# Patient Record
Sex: Male | Born: 1965 | Race: Black or African American | Hispanic: No | Marital: Married | Smoking: Former smoker
Health system: Southern US, Community
[De-identification: ages and names within clinical notes are randomized; demographics above are authoritative.]

## PROBLEM LIST (undated history)

## (undated) DIAGNOSIS — I10 Essential (primary) hypertension: Secondary | ICD-10-CM

## (undated) DIAGNOSIS — E291 Testicular hypofunction: Secondary | ICD-10-CM

## (undated) DIAGNOSIS — T7840XA Allergy, unspecified, initial encounter: Secondary | ICD-10-CM

## (undated) HISTORY — DX: Allergy, unspecified, initial encounter: T78.40XA

## (undated) HISTORY — PX: CHOLECYSTECTOMY: SHX55

## (undated) HISTORY — DX: Testicular hypofunction: E29.1

## (undated) HISTORY — DX: Essential (primary) hypertension: I10

---

## 2000-03-19 ENCOUNTER — Encounter: Payer: Self-pay | Admitting: Family Medicine

## 2000-03-19 ENCOUNTER — Encounter: Admission: RE | Admit: 2000-03-19 | Discharge: 2000-03-19 | Payer: Self-pay | Admitting: Family Medicine

## 2001-01-30 ENCOUNTER — Emergency Department (HOSPITAL_COMMUNITY): Admission: EM | Admit: 2001-01-30 | Discharge: 2001-01-31 | Payer: Self-pay | Admitting: Emergency Medicine

## 2001-06-22 ENCOUNTER — Emergency Department (HOSPITAL_COMMUNITY): Admission: EM | Admit: 2001-06-22 | Discharge: 2001-06-22 | Payer: Self-pay | Admitting: Emergency Medicine

## 2001-09-19 ENCOUNTER — Emergency Department (HOSPITAL_COMMUNITY): Admission: EM | Admit: 2001-09-19 | Discharge: 2001-09-19 | Payer: Self-pay | Admitting: Emergency Medicine

## 2001-09-20 ENCOUNTER — Inpatient Hospital Stay (HOSPITAL_COMMUNITY): Admission: EM | Admit: 2001-09-20 | Discharge: 2001-09-22 | Payer: Self-pay | Admitting: Emergency Medicine

## 2001-09-20 ENCOUNTER — Encounter (INDEPENDENT_AMBULATORY_CARE_PROVIDER_SITE_OTHER): Payer: Self-pay | Admitting: *Deleted

## 2001-09-20 ENCOUNTER — Encounter: Payer: Self-pay | Admitting: Surgery

## 2001-09-20 ENCOUNTER — Encounter: Payer: Self-pay | Admitting: Internal Medicine

## 2009-12-21 ENCOUNTER — Encounter: Admission: RE | Admit: 2009-12-21 | Discharge: 2009-12-21 | Payer: Self-pay | Admitting: Internal Medicine

## 2010-05-05 ENCOUNTER — Encounter: Payer: Self-pay | Admitting: Internal Medicine

## 2010-08-30 NOTE — H&P (Signed)
[facility]  Patient:    Brian Perkins, Brian Perkins Visit Number: [id number] MRN: [id number]          Service Type: MED Location: [phone] Attending Physician:  Andre Lefort Dictated by:   Sandria Bales. Ezzard Standing, M.D. Admit Date:  09/20/2001 Discharge Date: 09/22/2001   CC:         Charlynne Pander. Bruna Potter, M.D.   History and Physical  DATE OF BIRTH:  1966-01-07  HISTORY OF ILLNESS:  This is a pleasant 45 year old black male who has presented to the emergency room with abdominal pain.  Actually he was in last night and they thought he had a gastroenteritis and then he represented tonight.  He has no prior history of peptic ulcer disease, liver disease, pancreatic disease or change in bowel habits.  Evaluation this time has revealed a mildly elevated WBC of 11,300 and an ultrasound which is positive for gallstones with a thickened gallbladder wall consistent with acute cholecystitis.  Interestingly, the patient actually presented three months ago with similar symptoms and again it was blamed on gastroenteritis.  He had eaten a [employer] sandwich the other night which kind of precipitated this pain.  FAMILY HISTORY:  He has no family history of gallstone disease or other significant family history.  ALLERGIES:  He has no allergies.  CURRENT MEDICATIONS:  He is on no medications.  REVIEW OF SYSTEMS:  Pulmonary: No history of pneumonia, he does not smoke cigarettes.  Cardiac: No history of cardiac disease, chest pain or hypertension.  Gastrointestinal: No history of liver disease, pancreatic disease, change in bowel habits or prior abdominal surgery.  Urologic: No history of kidney stones or kidney infections.  SOCIAL HISTORY:  He works as a Nurse, adult, is single and comes by himself to the emergency room.  PHYSICAL EXAMINATION:  VITAL SIGNS:  His temperature is 99.3, blood pressure 149/98, pulse 90, respirations 20.  GENERAL:  He is a  well-nourished, pleasant black male alert and cooperative on physical exam.  HEENT:  Unremarkable.  NECK:  Supple, I feel no mass and no thyromegaly.  LUNGS:  His lungs are clear to auscultation and symmetric.  HEART:  His heart has a regular rate and rhythm.  ABDOMEN:  He has some epigastric right upper quadrant tenderness with some very mild maybe guarding.  I am not sure I can feel any kind of mass.  He is a pretty stocky guy.  He has no other abdominal mass.  No evidence of inguinal hernia.  EXTREMITIES:  He has good strength in all four extremities.  NEUROLOGICALLY:  He is grossly intact.  ASSESSMENT AND PLAN:  My impression is that he has acute cholecystitis and cholelithiasis.  I discussed with him that he needs surgical treatment; that the benefits of surgery and the potential risk including, but not limited to, bleeding, infection, bowel injury, bile duct injury, and possibly open surgery; I think he understands this and will proceed with surgery later this morning.  Will plan to admit him to the hospital, place him on IV fluids, IV antibiotics and schedule him at the earliest convenient time.  It is about 4 a.m. right now.  I talked to the OR and they can probably do him in about four or five hours. Dictated by:   Sandria Bales. Ezzard Standing, M.D. Attending Physician:  Andre Lefort DD:  09/20/01 TD:  09/20/01 Job: [id number]

## 2010-08-30 NOTE — Op Note (Signed)
[place]  Patient:    DUNCAN, ALEJANDRO Visit Number: [id number] MRN: [id number]          Service Type: EMS Location: [place] Attending Physician:  Nelia Shi Dictated by:   Sandria Bales. Ezzard Standing, M.D. Proc. Date: 09/20/01 Admit Date:  09/19/2001 Discharge Date: 09/19/2001   CC:         Charlynne Pander. Bruna Potter, M.D.   Operative Report  PREOPERATIVE DIAGNOSIS:   Acute  cholelithiasis and cholecystitis.  POSTOPERATIVE DIAGNOSIS:  Acute and chronic cholelithiasis and cholecystitis.  PROCEDURES:  Laparoscopic cholecystectomy with intraoperative cholangiogram.  SURGEON:  Sandria Bales. Ezzard Standing, M.D.  FIRST ASSISTANT:  Jimmye Norman, M.D.  ANESTHESIA:  General endotracheal.  ESTIMATED BLOOD LOSS:  50 cc.  DRAINS:  ________ .  INDICATIONS FOR PROCEDURE:  The patient is a 45 year old black male who presents to the emergency room with acute cholecystitis, as diagnosed both by clinical exam and ultrasound.  He now comes for attempt at a laparoscopic cholecystectomy.  Discussion carried out with the patient, both the indications and potential complications of the procedure.  Complications included, but not limited to:  bleeding, infection, bowel and bile duct injury and open surgery.  OPERATIVE NOTE:  The patient placed in the supine position and given a general endotracheal anesthetic.  He was already on Ancef as an antibiotic.  He had general anesthesia supervised by Dr. Guadalupe Maple.  His abdomen was shaved, prepped with Betadine solution and sterilely draped.  He had PAS stockings in place and an orogastric tube placed.  An infraumbilical incision was made with sharp dissection carried down to the abdominal cavity.  A zero-degree laparoscope was inserted through a 12 mm Hasson trocar.  The Hasson trocar was secured with a 0 Vicryl suture.  The abdomen was insufflated with CO2, about 4 L.  Abdominal exploration carried out to reveal the right and left  lobes of the liver unremarkable.  The stomach was somewhat dilated and anesthesia was adjusted with an NG tube to decompress this.  The remainder of his bowels were unremarkable.  Three different trocars were placed, a 10 mm Ethigon trocar in the subxiphoid location, a 5 mm Ethigon trocar in the right mid subcostal location, and a 5 mm Ethigon trocar in the right lateral subcostal location.  The gallbladder was noted to be distended and acute.  We decompressed the gallbladder first, draining off approximately 50 cc of bilious material.  The gallbladder was then grabbed, rotated cephalad.  It was noted to be both acutely inflamed and had chronic disease.  It was then dissected out to identify the cystic duct.  The triangle of Calot was identified, with at least two branches which were presumed to be cystic arteries, which were doubly clipped and divided.  After isolating out the cystic duct and gallbladder, I then placed a clip of the gallbladder side of the cystic duct and shot an intraoperative cholangiogram.  Intraoperative cholangiogram was shot using a cut-off taut catheter inserted through a 14-gauge Jelco catheter.  The taut catheter was placed beside of the cut cystic duct and then secured with an Endo-Clip.  The intraoperative cholangiogram was used under fluoroscopy, using half-strength Hyapaque solution, injecting about 8 cc.  This showed free flow of contrast down the cystic duct into the common bile duct, into the duodenum and reflux at the hepatic radicals.  This is felt to be a normal intraoperative cholangiogram.  The taut catheter was then removed.  The cystic duct  was triply Endo-clipped and divided.  The gallbladder was then sharply and bluntly dissected from the gallbladder bed, using primarily hook Bovie coagulation.  Again, there is both acute edematous changes of the gallbladder and chronic scar tissue of the gallbladder being attached to the gallbladder wall.   The gallbladder was completely removed and placed in an EndoCatch bag and delivered through the umbilicus.  There are two or three split stones which were retrieved with the "pooper-scooper".  The abdomen was irrigated with a total of about 3.5 L of saline.  There was no bleeding from the gallbladder bed or from the triangle of Calot, nor was there any bile leak.  There were no residual stones.  The trocars were each removed under direct visualization.  There was no bleeding at any trocar site.  The umbilical trocar was then closed with a 0 Vicryl suture.  The skin edge cut was closed with a 5-0 Vicryl suture; painted with tincture of Benzoin and Steri-Strips and sterilely dressed.  The patient tolerated the procedure well and was transported to the recovery room in good condition.  Sponge and needle count were correct at the end of the case. Dictated by:   Sandria Bales. Ezzard Standing, M.D. Attending Physician:  Nelia Shi DD:  09/20/01 TD:  09/21/01 Job: [id number]

## 2011-03-26 ENCOUNTER — Ambulatory Visit: Payer: Self-pay

## 2011-06-03 ENCOUNTER — Telehealth: Payer: Self-pay

## 2011-06-03 NOTE — Telephone Encounter (Signed)
PTS WIFE, JAIME CALLED STATES PT NEEDS HIS 3 MEDS REFILLED, BUT WAS TOLD TO COME IN FOR RECHECK. STATES HE IS UNEMPLOYED AND WITHOUT INS AND CAN'T AFFORD AN OV. WANTS TO KNOW IF DR WILL RECONSIDER.

## 2011-06-05 MED ORDER — HYDROCHLOROTHIAZIDE 25 MG PO TABS: 25.0000 mg | ORAL_TABLET | Freq: Every day | ORAL | Status: DC

## 2011-06-05 MED ORDER — LISINOPRIL 40 MG PO TABS: 40.0000 mg | ORAL_TABLET | Freq: Every day | ORAL | Status: DC

## 2011-06-05 MED ORDER — AMLODIPINE BESYLATE 10 MG PO TABS: 10.0000 mg | ORAL_TABLET | Freq: Every day | ORAL | Status: DC

## 2011-06-05 NOTE — Telephone Encounter (Signed)
Explained need for check up for pt. Wife agrees and he will try to get in within the month. Told wife about [facility] financial aid w/ phone number, and also that we can set up paymt plan. Wife agrees. Sent in one month RFs on all three meds with OK per Alycia Rossetti.

## 2011-06-05 NOTE — Telephone Encounter (Signed)
What are his current meds?  When does he think he could come in?  A payment agreement would be acceptable.  He could also call the [facility] Financial Aid office and see if he qualifies for assistance. [phone]

## 2011-06-05 NOTE — Telephone Encounter (Signed)
Patients  wife calling to check the status on med refill for patient she states he is out of his medication.

## 2011-06-06 ENCOUNTER — Telehealth: Payer: Self-pay

## 2011-06-06 NOTE — Telephone Encounter (Signed)
Pt called stating [facility] doesn't have Rxs sent. Checked order on last phone message and Rxs printed instead of sending to pharmacy even though "normal" was clicked. Called in Rxs and notified wife done.

## 2011-06-06 NOTE — Telephone Encounter (Signed)
Brian Perkins: Pts wife states that the pharmacy never received a fax for the three rx's lisinopril, Norvasc, and Hydrodiuril.

## 2011-07-03 ENCOUNTER — Other Ambulatory Visit: Payer: Self-pay

## 2011-07-03 MED ORDER — HYDROCHLOROTHIAZIDE 25 MG PO TABS: 25.0000 mg | ORAL_TABLET | Freq: Every day | ORAL | Status: DC

## 2011-07-09 ENCOUNTER — Telehealth: Payer: Self-pay

## 2011-07-09 ENCOUNTER — Other Ambulatory Visit: Payer: Self-pay | Admitting: *Deleted

## 2011-07-09 MED ORDER — AMLODIPINE BESYLATE 10 MG PO TABS: 10.0000 mg | ORAL_TABLET | Freq: Every day | ORAL | Status: DC

## 2011-07-09 MED ORDER — LISINOPRIL 40 MG PO TABS: 40.0000 mg | ORAL_TABLET | Freq: Every day | ORAL | Status: DC

## 2011-07-09 NOTE — Telephone Encounter (Signed)
PTS WIFE JAMIEN STATES THAT THE PHARMACY HAS ALREADY FAXED OVER A REQUEST FOR PTS REFILL ON AMLODIPINE AND LISINOPRIL AND THEY HAVE NOT HEARD ANYTHING FROM [place] AS OF YET. PHARMACY: [facility] ON [address].

## 2011-07-09 NOTE — Telephone Encounter (Signed)
Advised pt wife that rx's were being sent in.  Pt has an appt on 07/11/11

## 2011-07-11 ENCOUNTER — Telehealth: Payer: Self-pay

## 2011-07-11 ENCOUNTER — Encounter: Payer: Self-pay | Admitting: Physician Assistant

## 2011-07-11 ENCOUNTER — Ambulatory Visit (INDEPENDENT_AMBULATORY_CARE_PROVIDER_SITE_OTHER): Payer: BC Managed Care – PPO | Admitting: Physician Assistant

## 2011-07-11 VITALS — BP 134/82 | HR 119 | Temp 98.5°F | Resp 16 | Ht 71.0 in | Wt 259.8 lb

## 2011-07-11 DIAGNOSIS — I10 Essential (primary) hypertension: Secondary | ICD-10-CM | POA: Insufficient documentation

## 2011-07-11 DIAGNOSIS — E669 Obesity, unspecified: Secondary | ICD-10-CM

## 2011-07-11 MED ORDER — TESTOSTERONE CYPIONATE 200 MG/ML IM SOLN: INTRAMUSCULAR | Status: DC

## 2011-07-11 MED ORDER — HYDROCHLOROTHIAZIDE 25 MG PO TABS: 25.0000 mg | ORAL_TABLET | Freq: Every day | ORAL | Status: DC

## 2011-07-11 MED ORDER — LISINOPRIL 40 MG PO TABS: 40.0000 mg | ORAL_TABLET | Freq: Every day | ORAL | Status: DC

## 2011-07-11 MED ORDER — AMLODIPINE BESYLATE 10 MG PO TABS: 10.0000 mg | ORAL_TABLET | Freq: Every day | ORAL | Status: DC

## 2011-07-11 NOTE — Telephone Encounter (Signed)
Patient was called back and given bp reading.

## 2011-07-11 NOTE — Progress Notes (Signed)
  Subjective:    Patient ID: Brian Perkins, male    DOB: [birth year], 46 y.o.   MRN: [id number]  HPI BP's running great.  Brings in typed info from spouse Brian Perkins).  Needs cheaper testosterone alternative. He states he is doing well and will schedule a CPE in a couple of weeks.  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 Needs other meds refilled  Review of Systems  All other systems reviewed and are negative.        Objective:   Physical Exam  Constitutional: He is oriented to person, place, and time. He appears well-developed and well-nourished.  HENT:  Head: Normocephalic and atraumatic.  Cardiovascular: Normal rate, regular rhythm and normal heart sounds.   Pulmonary/Chest: Effort normal and breath sounds normal.  Neurological: He is alert and oriented to person, place, and time.  Skin: Skin is warm and dry.          Assessment & Plan:  htn-controlled (numbers better at home)-Continue current meds Low testosterone-ok to switch back to injectables.  I will call his wife Brian Perkins on Monday to discuss the alternatives to get his injections restarted.  [phone]

## 2011-07-11 NOTE — Telephone Encounter (Signed)
PT STATS WAS SEEN TODAY AND WANTS TO GET HIS BLOOD PRESSURE READINGS PLEASE CALL PT ON HIS CELL PHONE, NUMBER PROVIDED

## 2011-07-23 ENCOUNTER — Encounter: Payer: Self-pay | Admitting: Physician Assistant

## 2011-07-23 ENCOUNTER — Ambulatory Visit: Payer: Self-pay | Admitting: Physician Assistant

## 2011-07-23 VITALS — BP 128/85 | HR 110 | Temp 97.7°F | Resp 16 | Ht 71.0 in | Wt 262.0 lb

## 2011-07-23 DIAGNOSIS — Z111 Encounter for screening for respiratory tuberculosis: Secondary | ICD-10-CM

## 2011-07-23 DIAGNOSIS — Z0289 Encounter for other administrative examinations: Secondary | ICD-10-CM

## 2011-07-23 LAB — POCT URINALYSIS DIPSTICK
Bilirubin, UA: NEGATIVE
Glucose, UA: NEGATIVE
Leukocytes, UA: NEGATIVE
Nitrite, UA: NEGATIVE

## 2011-07-23 LAB — GLUCOSE, POCT (MANUAL RESULT ENTRY): POC Glucose: 116

## 2011-07-23 MED ORDER — LISINOPRIL 40 MG PO TABS: 40.0000 mg | ORAL_TABLET | Freq: Every day | ORAL | Status: DC

## 2011-07-25 ENCOUNTER — Ambulatory Visit (INDEPENDENT_AMBULATORY_CARE_PROVIDER_SITE_OTHER): Payer: Self-pay

## 2011-07-25 DIAGNOSIS — Z111 Encounter for screening for respiratory tuberculosis: Secondary | ICD-10-CM

## 2011-07-26 NOTE — Progress Notes (Signed)
  Subjective:    Patient ID: Brian Perkins, male    DOB: 01/22/1966, 46 y.o.   MRN: [id number]  HPI See PE form,scanned in. No problems, has forms he needs filled out.  Review of Systems  All other systems reviewed and are negative.       Objective:   Physical Exam  Nursing note and vitals reviewed. Constitutional: He is oriented to person, place, and time. He appears well-developed and well-nourished.       obese  HENT:  Head: Normocephalic and atraumatic.  Left Ear: External ear normal.  Nose: Nose normal.  Mouth/Throat: Oropharynx is clear and moist. No oropharyngeal exudate.  Eyes: Conjunctivae and EOM are normal. Pupils are equal, round, and reactive to light.  Neck: Normal range of motion. Neck supple. No thyromegaly present.  Cardiovascular: Normal rate, regular rhythm, normal heart sounds and intact distal pulses.  Exam reveals no gallop and no friction rub.   No murmur heard. Pulmonary/Chest: Effort normal and breath sounds normal.  Abdominal: Soft. Bowel sounds are normal. He exhibits no distension and no mass. There is no tenderness. There is no rebound and no guarding.  Genitourinary: Penis normal. No penile tenderness.       No hernia  Musculoskeletal: Normal range of motion. He exhibits no edema and no tenderness.  Lymphadenopathy:    He has no cervical adenopathy.  Neurological: He is alert and oriented to person, place, and time. He has normal reflexes. He displays normal reflexes. No cranial nerve deficit. He exhibits normal muscle tone. Coordination normal.  Skin: Skin is warm and dry.  Psychiatric: He has a normal mood and affect. His behavior is normal. Judgment and thought content normal.          Assessment & Plan:  CPE-forms filled out. Htn-stable-continue current meds. RTC in 48-72 hrs for TB read. Advised for now, if he gets his testosterone filled, I will do his injections at no charge every 2 weeks at no charge until he gets new insurance.  I  spoke with his wife on the phone to discuss this as well.

## 2011-08-06 ENCOUNTER — Ambulatory Visit (INDEPENDENT_AMBULATORY_CARE_PROVIDER_SITE_OTHER): Payer: Self-pay

## 2011-08-06 ENCOUNTER — Other Ambulatory Visit: Payer: Self-pay | Admitting: Physician Assistant

## 2011-08-06 DIAGNOSIS — Z0289 Encounter for other administrative examinations: Secondary | ICD-10-CM

## 2011-08-06 DIAGNOSIS — E291 Testicular hypofunction: Secondary | ICD-10-CM

## 2011-08-06 MED ORDER — TESTOSTERONE CYPIONATE 200 MG/ML IM SOLN
200.0000 mg | INTRAMUSCULAR | Status: AC
  Administered 2011-08-20 – 2011-11-19 (×5): 200 mg via INTRAMUSCULAR

## 2011-08-20 ENCOUNTER — Telehealth: Payer: Self-pay

## 2011-08-20 ENCOUNTER — Ambulatory Visit (INDEPENDENT_AMBULATORY_CARE_PROVIDER_SITE_OTHER): Payer: Self-pay | Admitting: Physician Assistant

## 2011-08-20 DIAGNOSIS — E291 Testicular hypofunction: Secondary | ICD-10-CM

## 2011-08-20 DIAGNOSIS — R7989 Other specified abnormal findings of blood chemistry: Secondary | ICD-10-CM

## 2011-08-20 NOTE — Progress Notes (Signed)
  Subjective:    Patient ID: Brian Perkins, male    DOB: 12-27-1965, 46 y.o.   MRN: [id number]  HPI    Review of Systems     Objective:   Physical Exam   L glut 1mL IM     Assessment & Plan:

## 2011-08-20 NOTE — Telephone Encounter (Signed)
.  umfc The patient saw [facility] this morning and called back to request Rx for sinus infection.  The patient uses [facility] on [address] as his preferred pharmacy.  Please call patient at [phone].

## 2011-08-21 NOTE — Telephone Encounter (Signed)
No documentation in chart yet.  Brian Perkins called Brian Perkins, and she states this was not discussed at OV.  Unable to rx med for this without eval.  LMOM notifying patient.

## 2011-08-22 NOTE — Telephone Encounter (Signed)
Pt CB and stated he has been Rxd NS for allergy/sinus problems in the past and if he could get a RF on that, he will be in for f/up anyway on 5/22 and will get eval for sinus problem if the NS has not helped. Checked pt's chart and [name] had Rxd flonase for pt on 11/08/10 w/ RFs for a year. Notified pt.

## 2011-09-03 ENCOUNTER — Ambulatory Visit (INDEPENDENT_AMBULATORY_CARE_PROVIDER_SITE_OTHER): Payer: Self-pay | Admitting: Physician Assistant

## 2011-09-03 DIAGNOSIS — E291 Testicular hypofunction: Secondary | ICD-10-CM

## 2011-09-03 DIAGNOSIS — R7989 Other specified abnormal findings of blood chemistry: Secondary | ICD-10-CM

## 2011-09-03 MED ORDER — FLUTICASONE PROPIONATE 50 MCG/ACT NA SUSP: 2.0000 | Freq: Every day | NASAL | Status: AC

## 2011-09-17 ENCOUNTER — Ambulatory Visit (INDEPENDENT_AMBULATORY_CARE_PROVIDER_SITE_OTHER): Payer: Self-pay | Admitting: Physician Assistant

## 2011-09-17 DIAGNOSIS — E291 Testicular hypofunction: Secondary | ICD-10-CM

## 2011-09-17 DIAGNOSIS — R7989 Other specified abnormal findings of blood chemistry: Secondary | ICD-10-CM

## 2011-09-17 NOTE — Progress Notes (Signed)
  Subjective:    Patient ID: Brian Perkins, male    DOB: March 24, 1966, 46 y.o.   MRN: [id number]  HPI Here for testosterone injection   Review of Systems  All other systems reviewed and are negative.       Objective:   Physical Exam  Injection given      Assessment & Plan:  Inject in 3 weeks (June 26)

## 2011-10-08 ENCOUNTER — Ambulatory Visit (INDEPENDENT_AMBULATORY_CARE_PROVIDER_SITE_OTHER): Payer: Self-pay | Admitting: Physician Assistant

## 2011-10-08 DIAGNOSIS — R7989 Other specified abnormal findings of blood chemistry: Secondary | ICD-10-CM

## 2011-10-08 DIAGNOSIS — E291 Testicular hypofunction: Secondary | ICD-10-CM

## 2011-10-08 NOTE — Progress Notes (Signed)
  Subjective:    Patient ID: Brian Perkins, male    DOB: [birth year], 46 y.o.   MRN: [id number]  HPI Not seen by provider   Review of Systems N/A    Objective:   Physical Exam N/A        Assessment & Plan:  I gave pt of testosterone in Left upper outer quadrant.  LMA,CMA Back to 2 week schedule.

## 2011-10-22 ENCOUNTER — Ambulatory Visit (INDEPENDENT_AMBULATORY_CARE_PROVIDER_SITE_OTHER): Payer: Self-pay | Admitting: Physician Assistant

## 2011-10-22 DIAGNOSIS — E291 Testicular hypofunction: Secondary | ICD-10-CM

## 2011-11-05 ENCOUNTER — Ambulatory Visit (INDEPENDENT_AMBULATORY_CARE_PROVIDER_SITE_OTHER): Payer: Self-pay | Admitting: Physician Assistant

## 2011-11-05 DIAGNOSIS — R7989 Other specified abnormal findings of blood chemistry: Secondary | ICD-10-CM

## 2011-11-05 DIAGNOSIS — E291 Testicular hypofunction: Secondary | ICD-10-CM

## 2011-11-05 MED ORDER — TESTOSTERONE CYPIONATE 200 MG/ML IM SOLN: 200.0000 mg | Freq: Once | INTRAMUSCULAR | Status: DC

## 2011-11-05 NOTE — Progress Notes (Signed)
  Subjective:    Patient ID: Brian Perkins, male    DOB: [birth year], 46 y.o.   MRN: [id number]  HPI Here for testosterone injection  Review of Systems Not done    Objective:   Physical Examnot done        Assessment & Plan:  200mg  IM given Left glut.

## 2011-11-19 ENCOUNTER — Ambulatory Visit: Payer: Self-pay | Admitting: Physician Assistant

## 2011-11-19 DIAGNOSIS — E291 Testicular hypofunction: Secondary | ICD-10-CM

## 2011-12-04 ENCOUNTER — Telehealth: Payer: Self-pay

## 2011-12-04 NOTE — Telephone Encounter (Signed)
I have advised we will charge for administration of meds, she wants you to give okay for him not to have to pay.

## 2011-12-04 NOTE — Telephone Encounter (Signed)
JAMIE WOULD LIKE TO ASK ANGELA MCCLUNG P.A. C WHEN Kailash CAN COME IN FOR HIS INJECTION. HE MISSED LAST Wednesday BECAUSE HE WAS OUT OF HIS MEDS BUT NOW HE HAVE IT. PLEASE CALL [phone]

## 2011-12-04 NOTE — Telephone Encounter (Signed)
Amy, He can come in Wednesday at 8:45 at appointment center(I will be there) to make up for the one he missed.  I am no charging for his administration while he is between insurances.

## 2011-12-04 NOTE — Telephone Encounter (Signed)
Mailbox is full unable to leave message, patient can come in to have this done, he is to have it every 2 weeks

## 2011-12-04 NOTE — Telephone Encounter (Signed)
Pt wife is calling to say that he normally does not pay for the testosterone injection she wants to make sure that he is not going to have to pay when he comes in tomorrow, would like angela mcclung to call her when she gets a chance

## 2011-12-05 NOTE — Telephone Encounter (Signed)
Thank you I have called Marijean Niemann to advise.

## 2011-12-10 ENCOUNTER — Ambulatory Visit (INDEPENDENT_AMBULATORY_CARE_PROVIDER_SITE_OTHER): Payer: Self-pay

## 2011-12-10 DIAGNOSIS — E291 Testicular hypofunction: Secondary | ICD-10-CM

## 2011-12-24 ENCOUNTER — Ambulatory Visit (INDEPENDENT_AMBULATORY_CARE_PROVIDER_SITE_OTHER): Payer: Self-pay | Admitting: Physician Assistant

## 2011-12-24 DIAGNOSIS — R7989 Other specified abnormal findings of blood chemistry: Secondary | ICD-10-CM

## 2011-12-24 DIAGNOSIS — E291 Testicular hypofunction: Secondary | ICD-10-CM

## 2011-12-24 NOTE — Progress Notes (Signed)
  Subjective:    Patient ID: Brian Perkins, male    DOB: 08/15/1965, 46 y.o.   MRN: [id number]  HPI Here for testosterone injection  Review of Systems n/a    Objective:   Physical Exam n/a       Assessment & Plan:  Low testosterone-9:02am 1 ml Testosterone injected into R glut.

## 2012-01-07 ENCOUNTER — Telehealth: Payer: Self-pay

## 2012-01-07 ENCOUNTER — Ambulatory Visit: Payer: Self-pay

## 2012-01-07 NOTE — Telephone Encounter (Signed)
[name] do you want to do this?

## 2012-01-07 NOTE — Telephone Encounter (Signed)
Patient wants orders put in system, for testosterone injection, can these be put in?

## 2012-01-07 NOTE — Telephone Encounter (Signed)
Pt came by office today for bi-monthly injection.  No orders were in system from A. McClung.  Wife, Asher Muir would like to request at least 2 orders in the system.  [phone]

## 2012-01-11 NOTE — Telephone Encounter (Signed)
It is fine for him to get these done.  Can he come in at 104 on Wednesday morning and I will give him his injection? We are having a glitch in the computer system right now placing future orders.

## 2012-01-11 NOTE — Telephone Encounter (Signed)
Pt notified to come in on wed to 104

## 2012-01-14 ENCOUNTER — Ambulatory Visit (INDEPENDENT_AMBULATORY_CARE_PROVIDER_SITE_OTHER): Payer: Self-pay | Admitting: Physician Assistant

## 2012-01-14 DIAGNOSIS — E291 Testicular hypofunction: Secondary | ICD-10-CM

## 2012-01-14 DIAGNOSIS — R7989 Other specified abnormal findings of blood chemistry: Secondary | ICD-10-CM

## 2012-01-14 NOTE — Progress Notes (Signed)
  Subjective:    Patient ID: Brian Perkins, male    DOB: Apr 09, 1966, 46 y.o.   MRN: [id number]  HPI testosterone injection    Review of Systems n/a     Objective:   Physical Exam N/a 1ml testosterone given R glut 9:03 am        Assessment & Plan:  Testosterone 1ml IM q 2 weeks. (no charge) I will have him schedule an appointment to see me in early December.

## 2012-01-28 ENCOUNTER — Ambulatory Visit (INDEPENDENT_AMBULATORY_CARE_PROVIDER_SITE_OTHER): Payer: Self-pay | Admitting: Family Medicine

## 2012-01-28 DIAGNOSIS — E291 Testicular hypofunction: Secondary | ICD-10-CM

## 2012-01-28 MED ORDER — TESTOSTERONE CYPIONATE 200 MG/ML IM SOLN
200.0000 mg | INTRAMUSCULAR | Status: DC
  Administered 2012-01-28: 200 mg via INTRAMUSCULAR

## 2012-02-05 NOTE — Progress Notes (Signed)
Presents for testosterone injection only. 

## 2012-02-05 NOTE — Progress Notes (Signed)
Testosterone injection only

## 2012-02-11 ENCOUNTER — Ambulatory Visit (INDEPENDENT_AMBULATORY_CARE_PROVIDER_SITE_OTHER): Payer: Self-pay | Admitting: Family Medicine

## 2012-02-11 DIAGNOSIS — E291 Testicular hypofunction: Secondary | ICD-10-CM

## 2012-02-11 MED ORDER — TESTOSTERONE CYPIONATE 200 MG/ML IM SOLN
200.0000 mg | INTRAMUSCULAR | Status: DC
  Administered 2012-02-11: 200 mg via INTRAMUSCULAR

## 2012-02-25 ENCOUNTER — Ambulatory Visit (INDEPENDENT_AMBULATORY_CARE_PROVIDER_SITE_OTHER): Payer: Self-pay | Admitting: Family Medicine

## 2012-02-25 DIAGNOSIS — R7989 Other specified abnormal findings of blood chemistry: Secondary | ICD-10-CM

## 2012-02-25 DIAGNOSIS — E291 Testicular hypofunction: Secondary | ICD-10-CM

## 2012-02-25 MED ORDER — TESTOSTERONE CYPIONATE 200 MG/ML IM SOLN
200.0000 mg | Freq: Once | INTRAMUSCULAR | Status: AC
  Administered 2012-02-25: 200 mg via INTRAMUSCULAR

## 2012-02-25 NOTE — Progress Notes (Signed)
  Subjective:    Patient ID: Brian Perkins, male    DOB: 04-Jul-1965, 46 y.o.   MRN: [id number]  HPI    Review of Systems     Objective:   Physical Exam Testosterone injection only       Assessment & Plan:

## 2012-03-10 ENCOUNTER — Telehealth: Payer: Self-pay

## 2012-03-10 NOTE — Telephone Encounter (Signed)
Wife is calling to speak with [facility].  She received a message from [place] and has questions. Best number for Asher Muir (wife) is [phone]

## 2012-03-11 NOTE — Telephone Encounter (Signed)
Spoke with Asher Muir (Abdirahman's wife) about the OSHA testosterone issue.  Once [employer] is set up, she will call me back.  I will get with Galen Daft and find first available dates for his wife to come in and do injection training.

## 2012-03-22 ENCOUNTER — Telehealth: Payer: Self-pay

## 2012-03-22 NOTE — Telephone Encounter (Signed)
Patient requests phone call back about training for testosterone injections for her husband. Amy

## 2012-03-22 NOTE — Telephone Encounter (Signed)
JAMIE STATES SHE HAD LEFT A MESSAGE YESTERDAY FOR ANGLEA MCCLUNG TO GIVE HER A CALL REGARDING HER HUSBAND AND STILL HASN'T HEARD  PLEASE CALL [phone] AND SHE ONLY WANT ANGELA TO GIVE HER A CALL

## 2012-03-23 ENCOUNTER — Telehealth: Payer: Self-pay

## 2012-03-23 NOTE — Telephone Encounter (Signed)
Brian Perkins,   Both Brian Perkins and Brian Perkins called back please call again   [phone]

## 2012-03-24 ENCOUNTER — Telehealth: Payer: Self-pay

## 2012-03-24 NOTE — Telephone Encounter (Signed)
JAMIE CALLED AGAIN AND STATED THEY REALLY NEED A REFILL ON HER HUSBAND [facility]OSTER[facility]. STATES THEY HAVE TO COME IN IN THE MORNING TO BRING THE BOTTLE BACK. BUT REALLY NEED THAT MEDICINE ASAP. HAVE ALREADY CALLED FOR A CALL BACK PLEASE CALL [phone]    [facility] AT [phone]

## 2012-03-24 NOTE — Telephone Encounter (Signed)
The patient's wife called to return call to [facility], [facility].  Please return call to answer some questions that the patient's wife has concerning recent phone calls.  Please call patient's wife at [phone].

## 2012-03-25 NOTE — Telephone Encounter (Signed)
They have called, left messages for Brian Perkins to call, but will not speak to anyone else. Now states the request is for a refill of Testosterone, please advise.

## 2012-03-26 ENCOUNTER — Ambulatory Visit (INDEPENDENT_AMBULATORY_CARE_PROVIDER_SITE_OTHER): Payer: Private Health Insurance - Indemnity | Admitting: Physician Assistant

## 2012-03-26 ENCOUNTER — Encounter: Payer: Self-pay | Admitting: Physician Assistant

## 2012-03-26 VITALS — BP 92/70 | HR 112 | Temp 98.3°F | Resp 18 | Ht 72.0 in | Wt 246.0 lb

## 2012-03-26 DIAGNOSIS — Z131 Encounter for screening for diabetes mellitus: Secondary | ICD-10-CM

## 2012-03-26 DIAGNOSIS — E291 Testicular hypofunction: Secondary | ICD-10-CM

## 2012-03-26 DIAGNOSIS — R7989 Other specified abnormal findings of blood chemistry: Secondary | ICD-10-CM

## 2012-03-26 DIAGNOSIS — J209 Acute bronchitis, unspecified: Secondary | ICD-10-CM

## 2012-03-26 DIAGNOSIS — E785 Hyperlipidemia, unspecified: Secondary | ICD-10-CM

## 2012-03-26 DIAGNOSIS — I10 Essential (primary) hypertension: Secondary | ICD-10-CM

## 2012-03-26 DIAGNOSIS — H6980 Other specified disorders of Eustachian tube, unspecified ear: Secondary | ICD-10-CM

## 2012-03-26 DIAGNOSIS — Z125 Encounter for screening for malignant neoplasm of prostate: Secondary | ICD-10-CM

## 2012-03-26 LAB — GLUCOSE, POCT (MANUAL RESULT ENTRY): POC Glucose: 103 mg/dl — AB (ref 70–99)

## 2012-03-26 LAB — POCT CBC
Hemoglobin: 17.5 g/dL (ref 14.1–18.1)
MCHC: 32.1 g/dL (ref 31.8–35.4)
MID (cbc): 0.9 (ref 0–0.9)
MPV: 10.5 fL (ref 0–99.8)
POC Granulocyte: 6.8 (ref 2–6.9)
POC MID %: 9.1 %M (ref 0–12)
Platelet Count, POC: 333 10*3/uL (ref 142–424)
RBC: 6.76 M/uL — AB (ref 4.69–6.13)

## 2012-03-26 LAB — COMPREHENSIVE METABOLIC PANEL
ALT: 29 U/L (ref 0–53)
Alkaline Phosphatase: 66 U/L (ref 39–117)
Sodium: 137 mEq/L (ref 135–145)
Total Bilirubin: 1.3 mg/dL — ABNORMAL HIGH (ref 0.3–1.2)
Total Protein: 7.1 g/dL (ref 6.0–8.3)

## 2012-03-26 LAB — LIPID PANEL
LDL Cholesterol: 102 mg/dL — ABNORMAL HIGH (ref 0–99)
Total CHOL/HDL Ratio: 3.8 Ratio
VLDL: 13 mg/dL (ref 0–40)

## 2012-03-26 LAB — TSH: TSH: 1.236 u[IU]/mL (ref 0.350–4.500)

## 2012-03-26 MED ORDER — TESTOSTERONE CYPIONATE 200 MG/ML IM SOLN: INTRAMUSCULAR | Status: DC

## 2012-03-26 MED ORDER — LISINOPRIL 40 MG PO TABS: 40.0000 mg | ORAL_TABLET | Freq: Every day | ORAL | Status: DC

## 2012-03-26 MED ORDER — AZITHROMYCIN 250 MG PO TABS: ORAL_TABLET | ORAL | Status: DC

## 2012-03-26 MED ORDER — METHYLPREDNISOLONE ACETATE 80 MG/ML IJ SUSP
80.0000 mg | Freq: Once | INTRAMUSCULAR | Status: AC
  Administered 2012-03-26: 80 mg via INTRAMUSCULAR

## 2012-03-26 MED ORDER — AMLODIPINE BESYLATE 10 MG PO TABS: 10.0000 mg | ORAL_TABLET | Freq: Every day | ORAL | Status: DC

## 2012-03-26 MED ORDER — HYDROCHLOROTHIAZIDE 25 MG PO TABS: 25.0000 mg | ORAL_TABLET | Freq: Every day | ORAL | Status: DC

## 2012-03-26 MED ORDER — HYDROCODONE-HOMATROPINE 5-1.5 MG/5ML PO SYRP: 5.0000 mL | ORAL_SOLUTION | Freq: Three times a day (TID) | ORAL | Status: DC | PRN

## 2012-03-26 NOTE — Progress Notes (Signed)
  Subjective:    Patient ID: Brian Perkins, male    DOB: [birth year], 46 y.o.   MRN: [id number]  HPI Brian Perkins is here today with his wife for regular check up, his wife to do injection training for his testosterone shots so they can do them at home. He has had a cough and cold symptoms for a week. He feels like he is getting worse.  He has not had any fever.  He is coughing up discolored phlegm.  He hasn't had a testosterone injection since early November.  He can definitely tell he is not as energetic.  Review of Systems  All other systems reviewed and are negative.       Objective:   Physical Exam  Nursing note and vitals reviewed. Constitutional: He is oriented to person, place, and time. He appears well-developed and well-nourished.  HENT:  Head: Normocephalic and atraumatic.  Mouth/Throat: No oropharyngeal exudate.       L TM bulging with fluid and dull  Neck: Normal range of motion. Neck supple.  Cardiovascular: Normal rate, regular rhythm and normal heart sounds.   Pulmonary/Chest: Effort normal and breath sounds normal.  Neurological: He is alert and oriented to person, place, and time.  Skin: Skin is warm and dry.     Results for orders placed in visit on 07/23/11  GLUCOSE, POCT (MANUAL RESULT ENTRY)      Component Value Range   POC Glucose 116    POCT URINALYSIS DIPSTICK      Component Value Range   Color, UA yellow     Clarity, UA clear     Glucose, UA neg     Bilirubin, UA neg     Ketones, UA neg     Spec Grav, UA 1.020     Blood, UA trace     pH, UA 7.0     Protein, UA trace     Urobilinogen, UA 1.0     Nitrite, UA neg     Leukocytes, UA Negative          Assessment & Plan:  Hypertension-stable-a little low today without symptoms.make sure hydrated. continue current meds.   Low testosterone-checking levels today Testosterone injection training- Hyperlipidemia Bronchitis and eustachian tube dysfunction-see Rxs.  All other meds filled as well.

## 2012-03-29 LAB — TESTOSTERONE, FREE, TOTAL, SHBG
Sex Hormone Binding: 20 nmol/L (ref 13–71)
Testosterone: 127.17 ng/dL — ABNORMAL LOW (ref 300–890)

## 2012-11-12 NOTE — Progress Notes (Signed)
  Subjective:    Patient ID: Brian Perkins, male    DOB: 1965/09/29, 47 y.o.   MRN: [id number]  HPI Injection only   Review of Systems     Objective:   Physical Exam        Assessment & Plan:

## 2013-01-25 ENCOUNTER — Ambulatory Visit (INDEPENDENT_AMBULATORY_CARE_PROVIDER_SITE_OTHER): Payer: Private Health Insurance - Indemnity | Admitting: Family Medicine

## 2013-01-25 VITALS — BP 124/98 | HR 96 | Temp 98.4°F | Resp 18 | Ht 71.0 in | Wt 251.0 lb

## 2013-01-25 DIAGNOSIS — M25529 Pain in unspecified elbow: Secondary | ICD-10-CM

## 2013-01-25 DIAGNOSIS — M25512 Pain in left shoulder: Secondary | ICD-10-CM

## 2013-01-25 DIAGNOSIS — E291 Testicular hypofunction: Secondary | ICD-10-CM

## 2013-01-25 DIAGNOSIS — R066 Hiccough: Secondary | ICD-10-CM

## 2013-01-25 DIAGNOSIS — I10 Essential (primary) hypertension: Secondary | ICD-10-CM

## 2013-01-25 DIAGNOSIS — G4726 Circadian rhythm sleep disorder, shift work type: Secondary | ICD-10-CM

## 2013-01-25 DIAGNOSIS — M25519 Pain in unspecified shoulder: Secondary | ICD-10-CM

## 2013-01-25 DIAGNOSIS — M25522 Pain in left elbow: Secondary | ICD-10-CM

## 2013-01-25 LAB — POCT CBC
Hemoglobin: 17.8 g/dL (ref 14.1–18.1)
Lymph, poc: 3.5 — AB (ref 0.6–3.4)
MCH, POC: 28.3 pg (ref 27–31.2)
MCHC: 33.1 g/dL (ref 31.8–35.4)
MPV: 11.1 fL (ref 0–99.8)
POC Granulocyte: 5.4 (ref 2–6.9)
POC LYMPH PERCENT: 36.3 %L (ref 10–50)
POC MID %: 7.5 %M (ref 0–12)
RDW, POC: 15.8 %
WBC: 9.6 10*3/uL (ref 4.6–10.2)

## 2013-01-25 LAB — LIPID PANEL
LDL Cholesterol: 108 mg/dL — ABNORMAL HIGH (ref 0–99)
Total CHOL/HDL Ratio: 4 Ratio
VLDL: 17 mg/dL (ref 0–40)

## 2013-01-25 LAB — COMPREHENSIVE METABOLIC PANEL
ALT: 24 U/L (ref 0–53)
Alkaline Phosphatase: 56 U/L (ref 39–117)
CO2: 33 mEq/L — ABNORMAL HIGH (ref 19–32)
Creat: 1.3 mg/dL (ref 0.50–1.35)
Glucose, Bld: 100 mg/dL — ABNORMAL HIGH (ref 70–99)
Sodium: 139 mEq/L (ref 135–145)
Total Bilirubin: 1.3 mg/dL — ABNORMAL HIGH (ref 0.3–1.2)
Total Protein: 7.4 g/dL (ref 6.0–8.3)

## 2013-01-25 LAB — AMYLASE: Amylase: 22 U/L (ref 0–105)

## 2013-01-25 LAB — PSA: PSA: 0.77 ng/mL (ref <=4.00)

## 2013-01-25 LAB — LIPASE: Lipase: 20 U/L (ref 0–75)

## 2013-01-25 MED ORDER — LISINOPRIL 40 MG PO TABS: 40.0000 mg | ORAL_TABLET | Freq: Every day | ORAL | Status: DC

## 2013-01-25 MED ORDER — TRAMADOL HCL 50 MG PO TABS: 50.0000 mg | ORAL_TABLET | Freq: Three times a day (TID) | ORAL | Status: DC | PRN

## 2013-01-25 MED ORDER — ZOLPIDEM TARTRATE 10 MG PO TABS: 10.0000 mg | ORAL_TABLET | Freq: Every evening | ORAL | Status: DC | PRN

## 2013-01-25 MED ORDER — HYDROCHLOROTHIAZIDE 25 MG PO TABS: 25.0000 mg | ORAL_TABLET | Freq: Every day | ORAL | Status: DC

## 2013-01-25 MED ORDER — CYCLOBENZAPRINE HCL 10 MG PO TABS: 10.0000 mg | ORAL_TABLET | Freq: Two times a day (BID) | ORAL | Status: DC | PRN

## 2013-01-25 MED ORDER — TESTOSTERONE CYPIONATE 200 MG/ML IM SOLN: INTRAMUSCULAR | Status: DC

## 2013-01-25 MED ORDER — AMLODIPINE BESYLATE 10 MG PO TABS: 10.0000 mg | ORAL_TABLET | Freq: Every day | ORAL | Status: DC

## 2013-01-25 NOTE — Patient Instructions (Signed)
Try a 1/2 or whole ambien pill as needed for insomnia.  Remember to be cautious with this medication and use as little as possible.  Tramadol for shoulder pain as needed  Flexeril as needed for shoulder pain/ hiccups/ muscle spasm.  Remember NOT to combine these 3 sedating medications together.  I will be in touch regarding the rest of your labs.

## 2013-01-25 NOTE — Progress Notes (Signed)
[facility] [address], [place] [address] [phone]  Date:  01/25/2013   Name:  Brian Perkins   DOB:  Sep 30, 1965   MRN:  [id number]  PCP:  No primary provider on file.    Chief Complaint: Shoulder Pain and Medication Refill   History of Present Illness:  Brian Perkins is a 47 y.o. very pleasant male patient who presents with the following:  Brian Perkins is here today with a few concerns.   His main concern is pain in his left arm.  He has noted this for about 10 days. The pain seemed to start in his chest, then into his back, then to the shoulder and finally down the left arm to the elbow.  He is not aware of any injury.  "It's like I hit my funny bone and it didn't go away."  He notes tingling and radiation of the pain down his left forearm.   He does have pain at night and cannot lie on the left shoulder.  He has pain with raising the arm; moving the arm around sometimes makes it hurt more but sometimes helps with pain.  He has pain if he presses on the left lateral elbow.    No increase of pain with exertion-actually feels better when active.   He has never had this before.   No prior history of shoulder problems or operations, but he did play football in the past.   They have tried ibuprofen, aleve, aspercreme, icy hot, etc.  These provide temporary relief.  No SOB.   He is not aware of any new activities, no new exercise program.  He did change an air filter which involved reaching high above his head just prior to first noting these sx.   He needs refills of BP medications.  Needs refill of testosterone which they inject at home.    Last labs in December. Would like to catch up on labs today  He is a Emergency planning/management officer at [employer].    He has had hiccups off and on for a couple of months. They may come and stay for 1 or 2 days, then resolve.  Can be caused by being upset or excitment at work, or seemingly without any cause.  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  When he has to sleep during the day he may have a very hard time getting to sleep and/ or staying asleep. Wonders if he might be able to use a medication for this as well  Patient Active Problem List   Diagnosis Date Noted  . Essential hypertension, benign 07/11/2011    Past Medical History  Diagnosis Date  . Allergy   . Hypertension   . Hypogonadism male     Past Surgical History  Procedure Laterality Date  . Cholecystectomy      History  Substance Use Topics  . Smoking status: Former Smoker    Types: Cigarettes, Cigars    Quit date: 04/14/1989  . Smokeless tobacco: Not on file     Comment: not much of smoker  . Alcohol Use: Yes     Comment: rarely 1 or 2 beers    Family History  Problem Relation Age of Onset  . Diabetes Mother   . Hyperlipidemia Father   . Hypertension Father     No Known Allergies  Medication list has been reviewed and updated.  Current Outpatient Prescriptions on File Prior to  Visit  Medication Sig Dispense Refill  . amLODipine (NORVASC) 10 MG tablet Take 1 tablet (10 mg total) by mouth daily.  90 tablet  3  . aspirin 81 MG tablet Take 81 mg by mouth daily.      . Cetirizine HCl (ZYRTEC ALLERGY PO) Take by mouth daily.      . Fish Oil OIL by Does not apply route daily.      . hydrochlorothiazide (HYDRODIURIL) 25 MG tablet Take 1 tablet (25 mg total) by mouth daily.  90 tablet  3  . lisinopril (PRINIVIL,ZESTRIL) 40 MG tablet Take 1 tablet (40 mg total) by mouth daily.  90 tablet  2  . omeprazole (PRILOSEC OTC) 20 MG tablet Take 20 mg by mouth daily.      Marland Kitchen testosterone cypionate (DEPO-TESTOSTERONE) 200 MG/ML injection Inject 1 ml IM every 2 weeks  10 mL  3  . fluticasone (FLONASE) 50 MCG/ACT nasal spray Place 2 sprays into the nose daily.  16 g  6   No current facility-administered medications on file prior to visit.    Review of Systems:  As per HPI- otherwise negative.   Physical  Examination: Filed Vitals:   01/25/13 0847  BP: 138/92  Pulse: 100  Temp: 98.4 F (36.9 C)  Resp: 18   Filed Vitals:   01/25/13 0847  Height: 5\' 11"  (1.803 m)  Weight: 251 lb (113.853 kg)   Body mass index is 35.02 kg/(m^2). Ideal Body Weight: Weight in (lb) to have BMI = 25: 178.9  GEN: WDWN, NAD, Non-toxic, A & O x 3, obese HEENT: Atraumatic, Normocephalic. Neck supple. No masses, No LAD.  Bilateral TM wnl, oropharynx normal.  PEERL,EOMI.   Ears and Nose: No external deformity. CV: RRR, No M/G/R. No JVD. No thrill. No extra heart sounds. PULM: CTA B, no wheezes, crackles, rhonchi. No retractions. No resp. distress. No accessory muscle use. ABD: S, NT, ND, +BS. No rebound. No HSM. EXTR: No c/c/e NEURO Normal gait.  PSYCH: Normally interactive. Conversant. Not depressed or anxious appearing.  Calm demeanor.  Left UE: he is tender over the lateral epicondyle of the elbow.  Left shoulder is tender with internal rotation and empty can testing, but is not weak. Able to perform resisted pronation and supination. Tender over anterior RC insertion at shoulder also.     EKG:  No ST elevation or depression to suggest ischemia.    Results for orders placed in visit on 01/25/13  POCT CBC      Result Value Range   WBC 9.6  4.6 - 10.2 K/uL   Lymph, poc 3.5 (*) 0.6 - 3.4   POC LYMPH PERCENT 36.3  10 - 50 %L   MID (cbc) 0.7  0 - 0.9   POC MID % 7.5  0 - 12 %M   POC Granulocyte 5.4  2 - 6.9   Granulocyte percent 56.2  37 - 80 %G   RBC 6.29 (*) 4.69 - 6.13 M/uL   Hemoglobin 17.8  14.1 - 18.1 g/dL   HCT, POC 16.1  09.6 - 53.7 %   MCV 85.4  80 - 97 fL   MCH, POC 28.3  27 - 31.2 pg   MCHC 33.1  31.8 - 35.4 g/dL   RDW, POC 04.5     Platelet Count, POC 260  142 - 424 K/uL   MPV 11.1  0 - 99.8 fL     Assessment and Plan: Left shoulder pain - Plan: Ambulatory referral to  Sports Medicine, cyclobenzaprine (FLEXERIL) 10 MG tablet, traMADol (ULTRAM) 50 MG tablet  Hypogonadism male - Plan:  PSA, Testosterone, testosterone cypionate (DEPO-TESTOSTERONE) 200 MG/ML injection  HTN (hypertension) - Plan: EKG 12-Lead, amLODipine (NORVASC) 10 MG tablet, hydrochlorothiazide (HYDRODIURIL) 25 MG tablet, lisinopril (PRINIVIL,ZESTRIL) 40 MG tablet, Lipid panel, 2D Echocardiogram without contrast  Left elbow pain - Plan: EKG 12-Lead, Ambulatory referral to Sports Medicine, cyclobenzaprine (FLEXERIL) 10 MG tablet  Intractable hiccups - Plan: POCT CBC, Comprehensive metabolic panel, Amylase, Lipase  Shift work sleep disorder - Plan: zolpidem (AMBIEN) 10 MG tablet  Shoulder and elbow pain.  Suspect he had left RC tendonitis which then led to left lateral epicondylitis.  Tramadol to use as needed for pain.  Referral to sports med HTN:  BP is controlled.  Continue medications.   EKG and exam/ history are reassuring that his left shoulder pain is not cardiac. EKG shows possible right atrial enlargement.  Referral for echo Hiccups: discussed various options. Check labs as above.  rx for flexeril to use as needed for spasm.  Suspect these will resolve eventually  Insomnia/ shift work disorder.   He has never used anything such as [place].  Discussed safe use in detail.  Rx for ambien to use as needed He is aware that he should NOT use the tramadol, flexeril and ambien in combination.    Refilled his testosterone and check his T, PSS, CBC, CHL levels  Signed Abbe Amsterdam, MD

## 2013-01-26 LAB — TESTOSTERONE: Testosterone: 167 ng/dL — ABNORMAL LOW (ref 300–890)

## 2013-01-27 ENCOUNTER — Encounter: Payer: Self-pay | Admitting: Family Medicine

## 2013-02-04 ENCOUNTER — Other Ambulatory Visit: Payer: Self-pay | Admitting: Family Medicine

## 2013-02-06 ENCOUNTER — Encounter: Payer: Self-pay | Admitting: Family Medicine

## 2013-02-07 MED ORDER — CYCLOBENZAPRINE HCL 10 MG PO TABS: 10.0000 mg | ORAL_TABLET | Freq: Two times a day (BID) | ORAL | Status: DC | PRN

## 2013-02-17 ENCOUNTER — Other Ambulatory Visit: Payer: Self-pay

## 2013-03-01 ENCOUNTER — Other Ambulatory Visit: Payer: Self-pay | Admitting: Family Medicine

## 2013-03-04 ENCOUNTER — Encounter: Payer: Self-pay | Admitting: Family Medicine

## 2013-03-07 ENCOUNTER — Encounter: Payer: Self-pay | Admitting: Family Medicine

## 2013-03-07 DIAGNOSIS — R066 Hiccough: Secondary | ICD-10-CM

## 2013-03-07 MED ORDER — BACLOFEN 10 MG PO TABS: ORAL_TABLET | ORAL | Status: DC

## 2013-03-07 NOTE — Telephone Encounter (Signed)
Called and spoke with his wife- I am so sorry for the delay, I saw this message but then it disappeared from my inbox and I was not able to remember his name.  The message came back today; I will send a mychart message to him and we can try some baclofen

## 2013-03-09 ENCOUNTER — Encounter: Payer: Self-pay | Admitting: *Deleted

## 2013-04-19 ENCOUNTER — Other Ambulatory Visit: Payer: Self-pay | Admitting: Family Medicine

## 2013-04-19 DIAGNOSIS — G4700 Insomnia, unspecified: Secondary | ICD-10-CM

## 2013-04-20 NOTE — Telephone Encounter (Signed)
Seen last October. Advise refill.

## 2013-06-15 ENCOUNTER — Encounter: Payer: Self-pay | Admitting: Family Medicine

## 2013-06-16 NOTE — Telephone Encounter (Signed)
Lm for pt. He should come in to see Dr. Patsy Lageropland for an OV to discuss other treatment options for his low testosterone.

## 2013-07-21 ENCOUNTER — Ambulatory Visit (INDEPENDENT_AMBULATORY_CARE_PROVIDER_SITE_OTHER): Payer: Private Health Insurance - Indemnity | Admitting: Family Medicine

## 2013-07-21 VITALS — BP 120/80 | HR 112 | Temp 98.5°F | Resp 16 | Ht 71.5 in | Wt 255.0 lb

## 2013-07-21 DIAGNOSIS — E291 Testicular hypofunction: Secondary | ICD-10-CM

## 2013-07-21 DIAGNOSIS — E663 Overweight: Secondary | ICD-10-CM

## 2013-07-21 DIAGNOSIS — Z125 Encounter for screening for malignant neoplasm of prostate: Secondary | ICD-10-CM

## 2013-07-21 DIAGNOSIS — Z13220 Encounter for screening for lipoid disorders: Secondary | ICD-10-CM

## 2013-07-21 DIAGNOSIS — I10 Essential (primary) hypertension: Secondary | ICD-10-CM

## 2013-07-21 DIAGNOSIS — R000 Tachycardia, unspecified: Secondary | ICD-10-CM

## 2013-07-21 LAB — CBC
HEMATOCRIT: 47 % (ref 39.0–52.0)
Hemoglobin: 16.6 g/dL (ref 13.0–17.0)
MCH: 28 pg (ref 26.0–34.0)
MCHC: 35.3 g/dL (ref 30.0–36.0)
MCV: 79.4 fL (ref 78.0–100.0)
PLATELETS: 328 10*3/uL (ref 150–400)
RBC: 5.92 MIL/uL — ABNORMAL HIGH (ref 4.22–5.81)
RDW: 16 % — AB (ref 11.5–15.5)
WBC: 10.8 10*3/uL — ABNORMAL HIGH (ref 4.0–10.5)

## 2013-07-21 LAB — COMPREHENSIVE METABOLIC PANEL
ALBUMIN: 4.1 g/dL (ref 3.5–5.2)
ALK PHOS: 72 U/L (ref 39–117)
ALT: 52 U/L (ref 0–53)
AST: 27 U/L (ref 0–37)
BUN: 16 mg/dL (ref 6–23)
CO2: 26 mEq/L (ref 19–32)
Calcium: 10 mg/dL (ref 8.4–10.5)
Chloride: 99 mEq/L (ref 96–112)
Creat: 1.1 mg/dL (ref 0.50–1.35)
Glucose, Bld: 102 mg/dL — ABNORMAL HIGH (ref 70–99)
POTASSIUM: 4 meq/L (ref 3.5–5.3)
Sodium: 136 mEq/L (ref 135–145)
Total Bilirubin: 1.6 mg/dL — ABNORMAL HIGH (ref 0.2–1.2)
Total Protein: 7.1 g/dL (ref 6.0–8.3)

## 2013-07-21 LAB — LIPID PANEL
Cholesterol: 183 mg/dL (ref 0–200)
HDL: 46 mg/dL (ref >39)
LDL CALC: 110 mg/dL — AB (ref 0–99)
Total CHOL/HDL Ratio: 4 Ratio
Triglycerides: 133 mg/dL (ref <150)
VLDL: 27 mg/dL (ref 0–40)

## 2013-07-21 MED ORDER — TESTOSTERONE 50 MG/5GM (1%) TD GEL: 5.0000 g | Freq: Every day | TRANSDERMAL | Status: DC

## 2013-07-21 NOTE — Progress Notes (Signed)
  Tuberculosis Risk Questionnaire  1. No Were you born outside the [place] in one of the following parts of the world: [place], [place], [place], [place] or [place]?    2. No Have you traveled outside the [place] and lived for more than one month in one of the following parts of the world: [place], [place], [place], [place] or [place]?    3. No Do you have a compromised immune system such as from any of the following conditions:HIV/AIDS, organ or bone marrow transplantation, diabetes, immunosuppressive medicines (e.g. Prednisone, Remicaide), leukemia, lymphoma, cancer of the head or neck, gastrectomy or jejunal bypass, end-stage renal disease (on dialysis), or silicosis?     4. No Have you ever or do you plan on working in: a residential care center, a health care facility, a jail or prison or homeless shelter?    5. No Have you ever: injected illegal drugs, used crack cocaine, lived in a homeless shelter  or been in jail or prison?     6. No Have you ever been exposed to anyone with infectious tuberculosis?    Tuberculosis Symptom Questionnaire  Do you currently have any of the following symptoms?  1. No Unexplained cough lasting more than 3 weeks?   2. No Unexplained fever lasting more than 3 weeks.   3. No Night Sweats (sweating that leaves the bedclothes and sheets wet)     4. No Shortness of Breath   5. No Chest Pain   6. No Unintentional weight loss    7. No Unexplained fatigue (very tired for no reason)    U/A  SpGr- 1.015 Protein 30 Blood- Neg Glucose- Neg

## 2013-07-21 NOTE — Progress Notes (Signed)
[facility] [address], [place] [address] [phone]  Date:  07/21/2013   Name:  Brian Perkins   DOB:  [birth year]   MRN:  [id number]  PCP:  Abbe Amsterdam, MD    Chief Complaint: Employment Physical   History of Present Illness:  Brian Perkins is a 48 y.o. 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   He has had low T since his 3s- not currently on any replacement.  He had been on T injections but does not wish to do this any longer due to pain.  He would like to try androgel instead perhaps.  His wife is concerned that he has a lot of sx due to hypogonadism such as fatigue, moodiness, low libido and lack of focus.  Brian Perkins himself is not so sure that the T therapy helps him that much.    He never did go for his echo after our last visit in October.  He states that his resting pulse is often over 100; he feels fine today and does not have any sx.   He is fasting today as well  Patient Active Problem List   Diagnosis Date Noted  . Essential hypertension, benign 07/11/2011    Past Medical History  Diagnosis Date  . Allergy   . Hypertension   . Hypogonadism male     Past Surgical History  Procedure Laterality Date  . Cholecystectomy      History  Substance Use Topics  . Smoking status: Former Smoker    Types: Cigarettes, Cigars    Quit date: 04/14/1989  . Smokeless tobacco: Not on file     Comment: not much of smoker  . Alcohol Use: Yes     Comment: rarely 1 or 2 beers    Family History  Problem Relation Age of Onset  . Diabetes Mother   . Hyperlipidemia Father   . Hypertension Father     No Known Allergies  Medication list has been reviewed and updated.  Current Outpatient Prescriptions on File Prior to Visit  Medication Sig Dispense Refill  . amLODipine  (NORVASC) 10 MG tablet Take 1 tablet (10 mg total) by mouth daily.  90 tablet  3  . aspirin 81 MG tablet Take 81 mg by mouth daily.      . Fish Oil OIL by Does not apply route daily.      . hydrochlorothiazide (HYDRODIURIL) 25 MG tablet Take 1 tablet (25 mg total) by mouth daily.  90 tablet  3  . lisinopril (PRINIVIL,ZESTRIL) 40 MG tablet Take 1 tablet (40 mg total) by mouth daily.  90 tablet  2  . omeprazole (PRILOSEC OTC) 20 MG tablet Take 20 mg by mouth daily.      Marland Kitchen testosterone cypionate (DEPO-TESTOSTERONE) 200 MG/ML injection Inject 1 ml IM every 2 weeks  10 mL  3  . zolpidem (AMBIEN) 10 MG tablet TAKE ONE TABLET BY MOUTH AT BEDTIME AS NEEDED FOR SLEEP   30 tablet  1  . baclofen (LIORESAL) 10 MG tablet Take a 1/2 tab TID as needed for hiccups.  30 each  0  . Cetirizine HCl (ZYRTEC ALLERGY PO) Take by mouth daily.      . cyclobenzaprine (FLEXERIL) 10 MG tablet Take 1 tablet (10 mg total) by  mouth 2 (two) times daily as needed for muscle spasms.  30 tablet  0  . fluticasone (FLONASE) 50 MCG/ACT nasal spray Place 2 sprays into the nose daily.  16 g  6  . traMADol (ULTRAM) 50 MG tablet Take 1 tablet (50 mg total) by mouth every 8 (eight) hours as needed for pain.  30 tablet  0   No current facility-administered medications on file prior to visit.    Review of Systems:  As per HPI- otherwise negative.   Physical Examination: Filed Vitals:   07/21/13 1338  BP: 120/80  Pulse: 112  Temp: 98.5 F (36.9 C)  Resp: 16   Filed Vitals:   07/21/13 1338  Height: 5' 11.5" (1.816 m)  Weight: 255 lb (115.667 kg)   Body mass index is 35.07 kg/(m^2). Ideal Body Weight: Weight in (lb) to have BMI = 25: 181.4  GEN: WDWN, NAD, Non-toxic, A & O x 3, obese, looks well HEENT: Atraumatic, Normocephalic. Neck supple. No masses, No LAD.  Bilateral TM wnl, oropharynx normal.  PEERL,EOMI.   Ears and Nose: No external deformity. CV: RRR, No M/G/R. No JVD. No thrill. No extra heart sounds. PULM: CTA  B, no wheezes, crackles, rhonchi. No retractions. No resp. distress. No accessory muscle use. ABD: S, NT, ND, +BS. No rebound. No HSM. EXTR: No c/c/e.  Normal strength and sensation of all extremities  NEURO Normal gait.  PSYCH: Normally interactive. Conversant. Not depressed or anxious appearing.  Calm demeanor.   EKG: sinus tach with rate of 110 BPM.  He also does appear to have right atrial enlargement  Assessment and Plan: Screening for hyperlipidemia - Plan: Lipid panel  HTN (hypertension) - Plan: Comprehensive metabolic panel, TSH, 2D Echocardiogram without contrast  Overweight  Screening for prostate cancer - Plan: PSA  Hypogonadism male - Plan: CBC, testosterone (ANDROGEL) 50 MG/5GM (1%) GEL, Testosterone, free, Testosterone  Tachycardia, unspecified - Plan: EKG 12-Lead  Labs pending as above today.  He will come in for a T level tomorrow- needs to be done in the am.  rx for androgel given today to hold.   Refer back for echocardiogram to further evaluate borderline EKG.   Cleared for work.  He is negative for TB screening questionnaire.    Signed Abbe AmsterdamJessica Eulla Kochanowski, MD

## 2013-07-21 NOTE — Patient Instructions (Addendum)
Please come and see us for am labs in the next few days.  I will order your testosterone levels and get back to you with results asap.    We will arrange for an echo to evaluate your heart

## 2013-07-22 ENCOUNTER — Other Ambulatory Visit (INDEPENDENT_AMBULATORY_CARE_PROVIDER_SITE_OTHER): Payer: Private Health Insurance - Indemnity | Admitting: *Deleted

## 2013-07-22 DIAGNOSIS — E291 Testicular hypofunction: Secondary | ICD-10-CM

## 2013-07-22 LAB — TSH: TSH: 2.058 u[IU]/mL (ref 0.350–4.500)

## 2013-07-22 LAB — TESTOSTERONE, FREE

## 2013-07-22 LAB — PSA: PSA: 0.97 ng/mL (ref <=4.00)

## 2013-07-22 NOTE — Progress Notes (Signed)
Patient here for lab work only per Dr Cyndie Chimeopland's OV from 07/21/13: testosterone and free testosterone

## 2013-07-25 LAB — TESTOSTERONE, FREE, TOTAL, SHBG
SEX HORMONE BINDING: 18 nmol/L (ref 13–71)
Testosterone, Free: 30.7 pg/mL — ABNORMAL LOW (ref 47.0–244.0)
Testosterone-% Free: 2.5 % (ref 1.6–2.9)
Testosterone: 122 ng/dL — ABNORMAL LOW (ref 300–890)

## 2013-07-28 ENCOUNTER — Telehealth: Payer: Self-pay

## 2013-07-28 NOTE — Telephone Encounter (Signed)
PA needed for androgel. Completed form on covermymeds.

## 2013-08-01 NOTE — Telephone Encounter (Signed)
Received denial bc needed more questions answered. Called and answered add'l Questions. Went to review.

## 2013-08-03 NOTE — Telephone Encounter (Signed)
Wife left her # [phone] (on [employer]) to call w/decision bc she is often easier to reach. May LM on VM if can't reach her.

## 2013-08-03 NOTE — Telephone Encounter (Signed)
Pt's wife LM to see if we needed more info from them for PA. Advised her of status and that the only question that was asked that may cause a denial is whether pt has had two consecutive days of AM testosterone results that were low. Will wait for decision.

## 2013-08-08 ENCOUNTER — Telehealth: Payer: Self-pay

## 2013-08-08 NOTE — Telephone Encounter (Signed)
Called [employer] to check status of PA since have not heard back from them. Had been denied, but rep was able to rework answers on form d/t pt having been on testosterone replacement in the past (which had not currently been asked). Received approval of PA through 08/08/16. Notified pharm and pt's wife.

## 2013-08-08 NOTE — Telephone Encounter (Signed)
Patient's wife called in regards to his drug screen results. Please return call and advise as they are looking for the results in the mail. Thank you.

## 2013-08-09 ENCOUNTER — Other Ambulatory Visit: Payer: Self-pay | Admitting: Family Medicine

## 2013-08-09 DIAGNOSIS — G4700 Insomnia, unspecified: Secondary | ICD-10-CM

## 2013-08-09 NOTE — Telephone Encounter (Signed)
Spoke with patient and his wife Brian Perkins regarding his drug screen results.  Brian Perkins has given permission for his wife Brian Perkins to come by and pick-up a copy of his drug screen results.  I also forwarded patient a copy in the mail.

## 2013-09-08 ENCOUNTER — Telehealth: Payer: Self-pay | Admitting: Family Medicine

## 2013-09-08 DIAGNOSIS — G4700 Insomnia, unspecified: Secondary | ICD-10-CM

## 2013-09-08 MED ORDER — ZOLPIDEM TARTRATE 10 MG PO TABS: ORAL_TABLET | ORAL | Status: DC

## 2013-09-08 NOTE — Telephone Encounter (Signed)
Did refill.  Will send mychart message asking for more details about his use

## 2013-10-01 ENCOUNTER — Other Ambulatory Visit: Payer: Self-pay | Admitting: Family Medicine

## 2013-10-18 ENCOUNTER — Other Ambulatory Visit: Payer: Self-pay | Admitting: Family Medicine

## 2013-10-18 DIAGNOSIS — G4700 Insomnia, unspecified: Secondary | ICD-10-CM

## 2013-10-19 MED ORDER — ZOLPIDEM TARTRATE 10 MG PO TABS: ORAL_TABLET | ORAL | Status: DC

## 2013-10-20 NOTE — Telephone Encounter (Signed)
Pt's wife called to say that they DO NOT need ANOTHER script refill. She did not communicate with Mr. Brian Perkins to let him know that she had picked up his medication already.

## 2013-11-30 ENCOUNTER — Other Ambulatory Visit: Payer: Self-pay | Admitting: Family Medicine

## 2013-11-30 DIAGNOSIS — G4700 Insomnia, unspecified: Secondary | ICD-10-CM

## 2013-12-01 MED ORDER — ZOLPIDEM TARTRATE 10 MG PO TABS: ORAL_TABLET | ORAL | Status: DC

## 2014-01-27 ENCOUNTER — Telehealth: Payer: Self-pay

## 2014-01-27 ENCOUNTER — Encounter: Payer: Self-pay | Admitting: Family Medicine

## 2014-01-27 DIAGNOSIS — E291 Testicular hypofunction: Secondary | ICD-10-CM

## 2014-01-27 MED ORDER — TESTOSTERONE 50 MG/5GM (1%) TD GEL: 5.0000 g | Freq: Every day | TRANSDERMAL | Status: DC

## 2014-01-27 NOTE — Telephone Encounter (Signed)
Pt is requesting a refill of androgel, please!

## 2014-01-28 NOTE — Telephone Encounter (Signed)
Done yesterday.

## 2014-02-07 ENCOUNTER — Other Ambulatory Visit: Payer: Self-pay | Admitting: Family Medicine

## 2014-02-09 ENCOUNTER — Ambulatory Visit (INDEPENDENT_AMBULATORY_CARE_PROVIDER_SITE_OTHER): Payer: Private Health Insurance - Indemnity | Admitting: Family Medicine

## 2014-02-09 VITALS — BP 130/80 | HR 103 | Temp 98.3°F | Resp 18 | Ht 70.5 in | Wt 266.2 lb

## 2014-02-09 DIAGNOSIS — Z79899 Other long term (current) drug therapy: Secondary | ICD-10-CM

## 2014-02-09 DIAGNOSIS — Z79890 Hormone replacement therapy (postmenopausal): Secondary | ICD-10-CM

## 2014-02-09 DIAGNOSIS — Z5181 Encounter for therapeutic drug level monitoring: Secondary | ICD-10-CM

## 2014-02-09 DIAGNOSIS — K118 Other diseases of salivary glands: Secondary | ICD-10-CM

## 2014-02-09 LAB — CBC
HEMATOCRIT: 41.3 % (ref 39.0–52.0)
Hemoglobin: 15.2 g/dL (ref 13.0–17.0)
MCH: 29.4 pg (ref 26.0–34.0)
MCHC: 36.8 g/dL — ABNORMAL HIGH (ref 30.0–36.0)
MCV: 79.9 fL (ref 78.0–100.0)
PLATELETS: 289 10*3/uL (ref 150–400)
RBC: 5.17 MIL/uL (ref 4.22–5.81)
RDW: 13.8 % (ref 11.5–15.5)
WBC: 12.2 10*3/uL — ABNORMAL HIGH (ref 4.0–10.5)

## 2014-02-09 LAB — MUMPS ANTIBODY, IGG: MUMPS IGG: 162 [AU]/ml — AB (ref <9.00)

## 2014-02-09 MED ORDER — AMOXICILLIN-POT CLAVULANATE 875-125 MG PO TABS: 1.0000 | ORAL_TABLET | Freq: Two times a day (BID) | ORAL | Status: DC

## 2014-02-09 NOTE — Patient Instructions (Signed)
You most likely have a viral infection or stone in your parotid gland causing your cheek to swell.  However mumps is also a consideration.  We are doing labs to help us determine if you could have mumps. In the meantime I would recommend that you NOT go to work as mumps could be contagious. We will also treat you with augmentin (antibiotic) and I recommend that you suck on sour candies to increase salvation. Drink plenty of water.  Also, your teeth are in need of attention.  Please schedule a cleaning as soon as you can.   I will be in touch with your labs asap- let me know if you are getting worse!

## 2014-02-09 NOTE — Progress Notes (Addendum)
[facility] [address], [place] [id number] [phone]  Date:  02/09/2014   Name:  Brian Perkins   DOB:  07/22/1965   MRN:  [id number]  PCP:  Abbe AmsterdamOPLAND,Shaul Trautman, MD    Chief Complaint: Facial Swelling and Labs   History of Present Illness:  Brian Perkins is a 48 y.o. very pleasant male patient who presents with the following:  Here today with concern of illness and also to follow-up low testosterone.  He noted swelling and tenderness of the left cheek late last night/ this am.  [place] and noted a mild tight feeling in the left jaw and cheek.  It seems to have gotten worse overnight while he was at work, and his wife noted that his cheek looked swollen this am.  Also his left ear feels like it is "pulling."  He has not noted any tooth pain, no pain in his teeth when he chews. No ear pain, no fever. As far as he knows he is fully immunized against mumps   He is about due for testosterone labs today anyway.    Patient Active Problem List   Diagnosis Date Noted  . Hypogonadism male 07/21/2013  . Essential hypertension, benign 07/11/2011    Past Medical History  Diagnosis Date  . Allergy   . Hypertension   . Hypogonadism male     Past Surgical History  Procedure Laterality Date  . Cholecystectomy      History  Substance Use Topics  . Smoking status: Former Smoker    Types: Cigarettes, Cigars    Quit date: 04/14/1989  . Smokeless tobacco: Not on file     Comment: not much of smoker  . Alcohol Use: Yes     Comment: rarely 1 or 2 beers    Family History  Problem Relation Age of Onset  . Diabetes Mother   . Hyperlipidemia Father   . Hypertension Father     No Known Allergies  Medication list has been reviewed and updated.  Current Outpatient Prescriptions on File Prior to Visit  Medication Sig Dispense Refill  . amLODipine (NORVASC) 10 MG tablet TAKE ONE TABLET BY MOUTH ONE TIME DAILY   90 tablet  0  .  aspirin 81 MG tablet Take 81 mg by mouth daily.      . Fish Oil OIL by Does not apply route daily.      . hydrochlorothiazide (HYDRODIURIL) 25 MG tablet TAKE ONE TABLET BY MOUTH ONE TIME DAILY   90 tablet  0  . lisinopril (PRINIVIL,ZESTRIL) 40 MG tablet TAKE ONE TABLET BY MOUTH ONE TIME DAILY   90 tablet  1  . omeprazole (PRILOSEC OTC) 20 MG tablet Take 20 mg by mouth daily.      Marland Kitchen. testosterone (ANDROGEL) 50 MG/5GM (1%) GEL Place 5 g onto the skin daily.  30 Package  0  . zolpidem (AMBIEN) 10 MG tablet TAKE ONE TABLET BY MOUTH NIGHTLY AT BEDTIME.  30 tablet  2  . baclofen (LIORESAL) 10 MG tablet Take a 1/2 tab TID as needed for hiccups.  30 each  0  . Cetirizine HCl (ZYRTEC ALLERGY PO) Take by mouth daily.      . cyclobenzaprine (FLEXERIL) 10 MG tablet Take 1 tablet (10 mg total) by mouth 2 (two) times daily as needed for muscle spasms.  30 tablet  0  . fluticasone (FLONASE) 50 MCG/ACT nasal spray Place 2 sprays into the  nose daily.  16 g  6  . traMADol (ULTRAM) 50 MG tablet Take 1 tablet (50 mg total) by mouth every 8 (eight) hours as needed for pain.  30 tablet  0   No current facility-administered medications on file prior to visit.    Review of Systems:  As per HPI- otherwise negative.   Physical Examination: Filed Vitals:   02/09/14 0817  BP: 130/80  Pulse: 103  Temp: 98.3 F (36.8 C)  Resp: 18   Filed Vitals:   02/09/14 0817  Height: 5' 10.5" (1.791 m)  Weight: 266 lb 3.2 oz (120.748 kg)   Body mass index is 37.64 kg/(m^2). Ideal Body Weight: Weight in (lb) to have BMI = 25: 176.4  GEN: WDWN, NAD, Non-toxic, A & O x 3, obese, looks well HEENT: Atraumatic, Normocephalic. Neck supple. No masses, No LAD.  Bilateral TM wnl, oropharynx normal.  PEERL,EOMI.   Teeth need cleaning but no tenderness, swelling or evidence of dental abscess.   His left parotid gland is swollen and slightly tender, not hot or red Ears and Nose: No external deformity. CV: RRR, No M/G/R. No JVD. No  thrill. No extra heart sounds. PULM: CTA B, no wheezes, crackles, rhonchi. No retractions. No resp. distress. No accessory muscle use. EXTR: No c/c/e NEURO Normal gait.  PSYCH: Normally interactive. Conversant. Not depressed or anxious appearing.  Calm demeanor.   Results for orders placed in visit on 02/09/14  MUMPS ANTIBODY, IGG      Result Value Ref Range   Mumps IgG    <9.00 AU/mL  CBC      Result Value Ref Range   WBC 12.2 (*) 4.0 - 10.5 K/uL   RBC 5.17  4.22 - 5.81 MIL/uL   Hemoglobin 15.2  13.0 - 17.0 g/dL   HCT 69.641.3  29.539.0 - 28.452.0 %   MCV 79.9  78.0 - 100.0 fL   MCH 29.4  26.0 - 34.0 pg   MCHC 36.8 (*) 30.0 - 36.0 g/dL   RDW 13.213.8  44.011.5 - 10.215.5 %   Platelets 289  150 - 400 K/uL  MUMPS ANTIBODY, IGM      Result Value Ref Range   Mumps IgM Value        Assessment and Plan: Parotid gland pain - Plan: Mumps antibody, IgG, CBC, amoxicillin-clavulanate (AUGMENTIN) 875-125 MG per tablet, Mumps antibody, IgM, Testosterone, free, total, Testosterone, free, total, CANCELED: Mumps antibody, IgM  Encounter for monitoring testosterone replacement therapy - Plan: Testosterone, free, total, Testosterone, free, total, CANCELED: Testosterone,Free and Total  Parotiditis.  Will check mumps ab titers, and start treatment with augmentin.  Note for work- he will not work until we get his mumps results back.  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 It does not appear that he has acute mumps, he is immune to mumps. T level is a little low.  As long as he feels well do nothing.  He he feels that he has sx can consider increasing his dose.  Let me know what he thinks.  OW plan recheck in 4-6 months

## 2014-02-10 ENCOUNTER — Telehealth: Payer: Self-pay | Admitting: Family Medicine

## 2014-02-10 LAB — TESTOSTERONE, FREE, TOTAL, SHBG
Sex Hormone Binding: 18 nmol/L (ref 13–71)
TESTOSTERONE FREE: 29.1 pg/mL — AB (ref 47.0–244.0)
TESTOSTERONE-% FREE: 2.5 % (ref 1.6–2.9)
Testosterone: 116 ng/dL — ABNORMAL LOW (ref 300–890)

## 2014-02-10 NOTE — Telephone Encounter (Signed)
Spoke with pt's wife.  Positive mumps IgG strongly suggests that he is immune and does not have acute mumps. He may RTW today as long a she feels well enough

## 2014-02-11 LAB — MUMPS ANTIBODY, IGM: Mumps IgM Value: <1:20 {titer}

## 2014-03-25 ENCOUNTER — Other Ambulatory Visit: Payer: Self-pay | Admitting: Family Medicine

## 2014-03-25 DIAGNOSIS — G4700 Insomnia, unspecified: Secondary | ICD-10-CM

## 2014-03-27 ENCOUNTER — Other Ambulatory Visit: Payer: Self-pay | Admitting: Family Medicine

## 2014-03-27 NOTE — Telephone Encounter (Addendum)
Patient's wife called and stated the following:  Patient came to see Dr. Patsy Lageropland about 2 months ago and had his medications refilled. Stated that Dr. Patsy Lageropland advised him that he needs to have an OV the next time he wants his meds refilled. Patient's wife states that her husband just had an OV recently and he doesn't understand why his med refills are being denied by his pharmacy ([facility] [address] Patient's wife also states that "pretty much all of his meds need to be refilled".  [phone] (patient's wife Asher MuirJamie)

## 2014-03-28 ENCOUNTER — Telehealth: Payer: Self-pay | Admitting: Family Medicine

## 2014-03-28 DIAGNOSIS — E291 Testicular hypofunction: Secondary | ICD-10-CM

## 2014-03-28 MED ORDER — HYDROCHLOROTHIAZIDE 25 MG PO TABS: 25.0000 mg | ORAL_TABLET | Freq: Every day | ORAL | Status: DC

## 2014-03-28 MED ORDER — TESTOSTERONE 50 MG/5GM (1%) TD GEL: 5.0000 g | Freq: Every day | TRANSDERMAL | Status: DC

## 2014-03-28 MED ORDER — LISINOPRIL 40 MG PO TABS: 40.0000 mg | ORAL_TABLET | Freq: Every day | ORAL | Status: DC

## 2014-03-28 MED ORDER — AMLODIPINE BESYLATE 10 MG PO TABS: 10.0000 mg | ORAL_TABLET | Freq: Every day | ORAL | Status: DC

## 2014-03-28 NOTE — Addendum Note (Signed)
Addended by: Abbe AmsterdamOPLAND, JESSICA C on: 03/28/2014 02:52 PM   Modules accepted: Orders

## 2014-03-28 NOTE — Telephone Encounter (Signed)
Tried to reach both wife's # below and pt's mobile. Both had VM that was full. H # DCd. Tried again and reached wife. Gave her message and advised all meds that were sent.

## 2014-03-28 NOTE — Telephone Encounter (Signed)
I have sent a 3 month supply.  At his last visit that was a sick visit and we did not do his labs for his chronic medical problems.  Please make an appt with Dr Patsy Lageropland for a recheck within the next 3 months.

## 2014-03-28 NOTE — Telephone Encounter (Signed)
Received phone message but it is blank

## 2014-03-28 NOTE — Telephone Encounter (Signed)
Called in.

## 2014-04-25 ENCOUNTER — Telehealth: Payer: Private Health Insurance - Indemnity | Admitting: Nurse Practitioner

## 2014-04-25 DIAGNOSIS — J209 Acute bronchitis, unspecified: Secondary | ICD-10-CM

## 2014-04-25 MED ORDER — AZITHROMYCIN 250 MG PO TABS: ORAL_TABLET | ORAL | Status: DC

## 2014-04-25 MED ORDER — BENZONATATE 100 MG PO CAPS: 100.0000 mg | ORAL_CAPSULE | Freq: Two times a day (BID) | ORAL | Status: DC | PRN

## 2014-04-25 NOTE — Progress Notes (Signed)
We are sorry that you are not feeling well.  Here is how we plan to help!  Based on what you have shared with me it looks like you have upper respiratory tract inflammation that has resulted in a signification cough.  Inflammation and infection in the upper respiratory tract is commonly called bronchitis and has four common causes:  Allergies, Viral Infections, Acid Reflux and Bacterial Infections.  Allergies, viruses and acid reflux are treated by controlling symptoms or eliminating the cause. An example might be a cough caused by taking certain blood pressure medications. You stop the cough by changing the medication. Another example might be a cough caused by acid reflux. Controlling the reflux helps control the cough.  Based on your presentation I believe you most likely have A cough due to bacteria.  When patients have a fever and a productive cough with a change in color or increased sputum production, we are concerned about bacterial bronchitis.  If left untreated it can progress to pneumonia.  If your symptoms do not improve with your treatment plan it is important that you contact your provider.   I have prescribed Azithromyin 250 mg: two tables now and then one tablet daily for 4 additonal days   In addition you may use A prescription cough medication called Tessalon Perles 100mg. You may take 1-2 capsules every 8 hours as needed for your cough.    HOME CARE . Only take medications as instructed by your medical team. . Complete the entire course of an antibiotic. . Drink plenty of fluids and get plenty of rest. . Avoid close contacts especially the very young and the elderly . Cover your mouth if you cough or cough into your sleeve. . Always remember to wash your hands . A steam or ultrasonic humidifier can help congestion.    GET HELP RIGHT AWAY IF: . You develop worsening fever. . You become short of breath . You cough up blood. . Your symptoms persist after you have completed your  treatment plan MAKE SURE YOU   Understand these instructions.  Will watch your condition.  Will get help right away if you are not doing well or get worse.  Your e-visit answers were reviewed by a board certified advanced clinical practitioner to complete your personal care plan.  Depending on the condition, your plan could have included both over the counter or prescription medications.  If there is a problem please reply  once you have received a response from your provider.  Your safety is important to us.  If you have drug allergies check your prescription carefully.    You can use MyChart to ask questions about today's visit, request a non-urgent call back, or ask for a work or school excuse.  You will get an e-mail in the next two days asking about your experience.  I hope that your e-visit has been valuable and will speed your recovery. Thank you for using e-visits.   

## 2014-04-30 ENCOUNTER — Telehealth: Payer: Private Health Insurance - Indemnity | Admitting: Family

## 2014-04-30 DIAGNOSIS — R05 Cough: Secondary | ICD-10-CM

## 2014-04-30 DIAGNOSIS — R059 Cough, unspecified: Secondary | ICD-10-CM

## 2014-04-30 DIAGNOSIS — J069 Acute upper respiratory infection, unspecified: Secondary | ICD-10-CM

## 2014-04-30 MED ORDER — BENZONATATE 200 MG PO CAPS: 200.0000 mg | ORAL_CAPSULE | Freq: Three times a day (TID) | ORAL | Status: DC | PRN

## 2014-04-30 NOTE — Progress Notes (Signed)
We are sorry that you are not feeling well.  Here is how we plan to help!  Based on what you have shared with me it looks like you have upper respiratory tract inflammation that has resulted in a signification cough.  Inflammation and infection in the upper respiratory tract is commonly called bronchitis and has four common causes:  Allergies, Viral Infections, Acid Reflux and Bacterial Infections.  Allergies, viruses and acid reflux are treated by controlling symptoms or eliminating the cause. An example might be a cough caused by taking certain blood pressure medications. You stop the cough by changing the medication. Another example might be a cough caused by acid reflux. Controlling the reflux helps control the cough.  Based on your presentation I believe you most likely have A cough due to bacteria.  When patients have a fever and a productive cough with a change in color or increased sputum production, we are concerned about bacterial bronchitis.  If left untreated it can progress to pneumonia.  If your symptoms do not improve with your treatment plan it is important that you contact your provider.  The zpak that was prescribe continues to work up to 10 days after to finish it. I sent another prescription of tessalon pearls.   In addition you may use A non-prescription cough medication called Robitussin DAC. Take 2 teaspoons every 8 hours or Delsym: take 2 teaspoons every 12 hours., A non-prescription cough medication called Mucinex DM: take 2 tablets every 12 hours. and A prescription cough medication called Tessalon Perles 100mg . You may take 1-2 capsules every 8 hours as needed for your cough.    HOME CARE . Only take medications as instructed by your medical team. . Complete the entire course of an antibiotic. . Drink plenty of fluids and get plenty of rest. . Avoid close contacts especially the very young and the elderly . Cover your mouth if you cough or cough into your sleeve. . Always  remember to wash your hands . A steam or ultrasonic humidifier can help congestion.    GET HELP RIGHT AWAY IF: . You develop worsening fever. . You become short of breath . You cough up blood. . Your symptoms persist after you have completed your treatment plan MAKE SURE YOU   Understand these instructions.  Will watch your condition.  Will get help right away if you are not doing well or get worse.  Your e-visit answers were reviewed by a board certified advanced clinical practitioner to complete your personal care plan.  Depending on the condition, your plan could have included both over the counter or prescription medications.  If there is a problem please reply  once you have received a response from your provider.  Your safety is important to us.  If you have drug allergies check your prescription carefully.    You can use MyChart to ask questions about today's visit, request a non-urgent call back, or ask for a work or school excuse.  You will get an e-mail in the next two days asking about your experience.  I hope that your e-visit has been valuable and will speed your recovery. Thank you for using e-visits.

## 2014-05-06 ENCOUNTER — Other Ambulatory Visit: Payer: Self-pay | Admitting: Physician Assistant

## 2014-05-28 ENCOUNTER — Other Ambulatory Visit: Payer: Self-pay | Admitting: Family Medicine

## 2014-05-28 DIAGNOSIS — G4700 Insomnia, unspecified: Secondary | ICD-10-CM

## 2014-06-01 ENCOUNTER — Other Ambulatory Visit: Payer: Self-pay

## 2014-06-01 DIAGNOSIS — G4700 Insomnia, unspecified: Secondary | ICD-10-CM

## 2014-06-01 MED ORDER — ZOLPIDEM TARTRATE 10 MG PO TABS: 10.0000 mg | ORAL_TABLET | Freq: Every evening | ORAL | Status: DC | PRN

## 2014-06-01 NOTE — Telephone Encounter (Signed)
Pharm reqs RF of ambien. Pended.

## 2014-06-01 NOTE — Telephone Encounter (Signed)
Ready

## 2014-06-01 NOTE — Telephone Encounter (Signed)
Please approve #[phone] ambien in my absence.  Thank you!

## 2014-06-02 NOTE — Telephone Encounter (Signed)
Faxed

## 2014-07-17 ENCOUNTER — Other Ambulatory Visit: Payer: Self-pay | Admitting: Physician Assistant

## 2014-07-17 DIAGNOSIS — G4700 Insomnia, unspecified: Secondary | ICD-10-CM

## 2014-07-19 MED ORDER — ZOLPIDEM TARTRATE 10 MG PO TABS: 10.0000 mg | ORAL_TABLET | Freq: Every evening | ORAL | Status: DC | PRN

## 2014-07-27 ENCOUNTER — Telehealth: Payer: Self-pay

## 2014-07-27 NOTE — Telephone Encounter (Signed)
-----   Message -----     From: Modesta Messingrevor L Willcox     Sent: 07/26/2014  7:55 PM      To: [facility] Clinical Message Pool    Subject: UJ:[id number]:refill                          Hi Doctor Copland. I have scheduled an annual-physical/office-visit for Monday, May 16 with you because that was the soonest slot showing available online.  However, I will need a refill on most of my medications prior to then. Will I be able to get the refills that I need? Thank you for your assistance!

## 2014-07-30 ENCOUNTER — Other Ambulatory Visit: Payer: Self-pay | Admitting: Physician Assistant

## 2014-07-31 ENCOUNTER — Other Ambulatory Visit: Payer: Self-pay | Admitting: Physician Assistant

## 2014-08-01 NOTE — Telephone Encounter (Signed)
These have been sent in to pharm and pt notified through Mychart.

## 2014-08-25 ENCOUNTER — Other Ambulatory Visit: Payer: Self-pay | Admitting: Family Medicine

## 2014-08-25 ENCOUNTER — Other Ambulatory Visit: Payer: Self-pay

## 2014-08-25 ENCOUNTER — Telehealth: Payer: Self-pay | Admitting: *Deleted

## 2014-08-25 DIAGNOSIS — G4700 Insomnia, unspecified: Secondary | ICD-10-CM

## 2014-08-25 MED ORDER — ZOLPIDEM TARTRATE 10 MG PO TABS: 10.0000 mg | ORAL_TABLET | Freq: Every evening | ORAL | Status: DC | PRN

## 2014-08-25 NOTE — Telephone Encounter (Signed)
[facility]

## 2014-08-25 NOTE — Telephone Encounter (Signed)
error 

## 2014-08-25 NOTE — Telephone Encounter (Signed)
Pt has OV scheduled for this coming Monday.

## 2014-08-28 ENCOUNTER — Encounter: Payer: Self-pay | Admitting: Family Medicine

## 2014-08-28 ENCOUNTER — Ambulatory Visit (INDEPENDENT_AMBULATORY_CARE_PROVIDER_SITE_OTHER): Payer: Managed Care, Other (non HMO) | Admitting: Family Medicine

## 2014-08-28 VITALS — BP 121/75 | HR 96 | Temp 98.8°F | Resp 16 | Ht 71.25 in | Wt 268.0 lb

## 2014-08-28 DIAGNOSIS — G4700 Insomnia, unspecified: Secondary | ICD-10-CM | POA: Diagnosis not present

## 2014-08-28 DIAGNOSIS — E669 Obesity, unspecified: Secondary | ICD-10-CM

## 2014-08-28 DIAGNOSIS — I10 Essential (primary) hypertension: Secondary | ICD-10-CM

## 2014-08-28 DIAGNOSIS — R9431 Abnormal electrocardiogram [ECG] [EKG]: Secondary | ICD-10-CM | POA: Diagnosis not present

## 2014-08-28 DIAGNOSIS — E291 Testicular hypofunction: Secondary | ICD-10-CM | POA: Diagnosis not present

## 2014-08-28 DIAGNOSIS — R000 Tachycardia, unspecified: Secondary | ICD-10-CM

## 2014-08-28 LAB — LIPID PANEL
Cholesterol: 187 mg/dL (ref 0–200)
HDL: 45 mg/dL (ref >=40)
LDL Cholesterol: 115 mg/dL — ABNORMAL HIGH (ref 0–99)
TRIGLYCERIDES: 137 mg/dL (ref <150)
Total CHOL/HDL Ratio: 4.2 Ratio
VLDL: 27 mg/dL (ref 0–40)

## 2014-08-28 LAB — CBC
HEMATOCRIT: 45 % (ref 39.0–52.0)
Hemoglobin: 16.2 g/dL (ref 13.0–17.0)
MCH: 28.8 pg (ref 26.0–34.0)
MCHC: 36 g/dL (ref 30.0–36.0)
MCV: 79.9 fL (ref 78.0–100.0)
MPV: 11.4 fL (ref 8.6–12.4)
PLATELETS: 298 10*3/uL (ref 150–400)
RBC: 5.63 MIL/uL (ref 4.22–5.81)
RDW: 13.8 % (ref 11.5–15.5)
WBC: 8.5 10*3/uL (ref 4.0–10.5)

## 2014-08-28 LAB — COMPREHENSIVE METABOLIC PANEL
ALK PHOS: 70 U/L (ref 39–117)
ALT: 39 U/L (ref 0–53)
AST: 22 U/L (ref 0–37)
Albumin: 3.9 g/dL (ref 3.5–5.2)
BILIRUBIN TOTAL: 0.8 mg/dL (ref 0.2–1.2)
BUN: 12 mg/dL (ref 6–23)
CO2: 27 meq/L (ref 19–32)
CREATININE: 1.1 mg/dL (ref 0.50–1.35)
Calcium: 9.8 mg/dL (ref 8.4–10.5)
Chloride: 101 mEq/L (ref 96–112)
Glucose, Bld: 96 mg/dL (ref 70–99)
Potassium: 3.9 mEq/L (ref 3.5–5.3)
SODIUM: 139 meq/L (ref 135–145)
Total Protein: 7.1 g/dL (ref 6.0–8.3)

## 2014-08-28 MED ORDER — ZOLPIDEM TARTRATE 10 MG PO TABS: 10.0000 mg | ORAL_TABLET | Freq: Every evening | ORAL | Status: DC | PRN

## 2014-08-28 MED ORDER — LISINOPRIL 40 MG PO TABS: 40.0000 mg | ORAL_TABLET | Freq: Every day | ORAL | Status: DC

## 2014-08-28 MED ORDER — AMLODIPINE BESYLATE 10 MG PO TABS: 10.0000 mg | ORAL_TABLET | Freq: Every day | ORAL | Status: DC

## 2014-08-28 MED ORDER — HYDROCHLOROTHIAZIDE 25 MG PO TABS: 25.0000 mg | ORAL_TABLET | Freq: Every day | ORAL | Status: DC

## 2014-08-28 NOTE — Patient Instructions (Addendum)
I will set you up for an echocardiogram to further evaluate your heart.  If you are not able to do this for any reason please just let me know I will then be in touch with your labs asap and we will see if you need any change to your testosterone level Continue to use ambien as needed for sleep.   Do work on getting some more exercise- let's aim for 5 lbs weight loss by September.

## 2014-08-28 NOTE — Progress Notes (Addendum)
[facility] [address], [place] [address] [phone]  Date:  08/28/2014   Name:  Brian Perkins   DOB:  July 04, 1965   MRN:  [id number]  PCP:  Abbe AmsterdamOPLAND,Anthonee Gelin, MD    Chief Complaint: Hypertension and Hypogonadism   History of Present Illness:  Brian Perkins is a 49 y.o. very pleasant male patient who presents with the following:  Here today for a recheck of his chronic conditions of hypogonadism and HTN, as well as shift work related insomnia.  Last T level in October of 2015.   He is fasting today for labs.  His wife feels that his testosterone level needs to be raised due to "his behavior."  Brian Perkins himself seems to feel that he is doing as would be expected given his work schedule He is working 15- 18 hour night shifts up to 6 days a week. 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  He is willing to go for the echo this time- they think they did not go in the past due to insurance concerns     BP Readings from Last 3 Encounters:  08/28/14 121/75  02/09/14 130/80  07/21/13 120/80    Wt Readings from Last 3 Encounters:  08/28/14 268 lb (121.564 kg)  02/09/14 266 lb 3.2 oz (120.748 kg)  07/21/13 255 lb (115.667 kg)     Patient Active Problem List   Diagnosis Date Noted  . Hypogonadism male 07/21/2013  . Essential hypertension, benign 07/11/2011    Past Medical History  Diagnosis Date  . Allergy   . Hypertension   . Hypogonadism male     Past Surgical History  Procedure Laterality Date  . Cholecystectomy      History  Substance Use Topics  . Smoking status: Former Smoker    Types: Cigarettes, Cigars    Quit date: 04/14/1989  . Smokeless tobacco: Not on file     Comment: not much of smoker   . Alcohol Use: Yes     Comment: rarely 1 or 2 beers    Family History  Problem Relation Age of Onset  . Diabetes Mother   . Hyperlipidemia Father   . Hypertension Father     No Known Allergies  Medication list has been reviewed and updated.  Current Outpatient Prescriptions on File Prior to Visit  Medication Sig Dispense Refill  . amLODipine (NORVASC) 10 MG tablet TAKE ONE TABLET BY MOUTH ONE TIME DAILY 30 tablet 0  . aspirin 81 MG tablet Take 81 mg by mouth daily.    . baclofen (LIORESAL) 10 MG tablet Take a 1/2 tab TID as needed for hiccups. 30 each 0  . benzonatate (TESSALON) 100 MG capsule Take 1 capsule (100 mg total) by mouth 2 (two) times daily as needed for cough. 20 capsule 0  . benzonatate (TESSALON) 200 MG capsule Take 1 capsule (200 mg total) by mouth 3 (three) times daily as needed. 30 capsule 1  . Cetirizine HCl (ZYRTEC ALLERGY PO) Take by mouth daily.    . cyclobenzaprine (FLEXERIL) 10 MG tablet Take 1 tablet (10 mg total) by mouth 2 (two) times daily as needed for  muscle spasms. 30 tablet 0  . Fish Oil OIL by Does not apply route daily.    . fluticasone (FLONASE) 50 MCG/ACT nasal spray Place 2 sprays into the nose daily. 16 g 6  . Glucosamine HCl-MSM (GLUCOSAMINE-MSM PO) Take 1,000 mg by mouth daily.    . hydrochlorothiazide (HYDRODIURIL) 25 MG tablet TAKE ONE TABLET BY MOUTH ONE TIME DAILY 30 tablet 0  . lisinopril (PRINIVIL,ZESTRIL) 40 MG tablet TAKE ONE TABLET BY MOUTH ONE TIME DAILY 30 tablet 0  . omeprazole (PRILOSEC OTC) 20 MG tablet Take 20 mg by mouth daily.    Marland Kitchen. testosterone (ANDROGEL) 50 MG/5GM (1%) GEL Place 5 g onto the skin daily. 30 Package 4  . traMADol (ULTRAM) 50 MG tablet Take 1 tablet (50 mg total) by mouth every 8 (eight) hours as needed for pain. 30 tablet 0  . Triamcinolone Acetonide (NASACORT AQ NA) Place into the nose.    . zolpidem (AMBIEN) 10 MG tablet Take 1 tablet (10 mg total) by mouth at bedtime as needed. 30 tablet 0   No current  facility-administered medications on file prior to visit.    Review of Systems:  As per HPI- otherwise negative. He did not have his echo done    Physical Examination: Filed Vitals:   08/28/14 0831  BP: 121/75  Pulse: 103  Temp: 98.8 F (37.1 C)  Resp: 16   Filed Vitals:   08/28/14 0831  Height: 5' 11.25" (1.81 m)  Weight: 268 lb (121.564 kg)   Body mass index is 37.11 kg/(m^2). Ideal Body Weight: Weight in (lb) to have BMI = 25: 180.1  GEN: WDWN, NAD, Non-toxic, A & O x 3, obese, looks well HEENT: Atraumatic, Normocephalic. Neck supple. No masses, No LAD. Ears and Nose: No external deformity. CV: RRR, No M/G/R. No JVD. No thrill. No extra heart sounds. PULM: CTA B, no wheezes, crackles, rhonchi. No retractions. No resp. distress. No accessory muscle use. ABD: S, NT, ND, +BS. No rebound. No HSM. EXTR: No c/c/e NEURO Normal gait.  PSYCH: Normally interactive. Conversant. Not depressed or anxious appearing.  Calm demeanor.    Assessment and Plan: Essential hypertension - Plan: CBC, Comprehensive metabolic panel, amLODipine (NORVASC) 10 MG tablet, hydrochlorothiazide (HYDRODIURIL) 25 MG tablet, lisinopril (PRINIVIL,ZESTRIL) 40 MG tablet  Hypogonadism in male - Plan: Lipid panel, PSA, Testosterone, Free, Total, SHBG, CANCELED: Testosterone,Free and Total  Obesity  Insomnia - Plan: zolpidem (AMBIEN) 10 MG tablet  Nonspecific abnormal electrocardiogram (ECG) (EKG) - Plan: Echocardiogram  Tachycardia - Plan: Echocardiogram   Refilled his Remus Lofflerambien that he uses for shift work related insomnia.  His wife is eager to increase his testosterone therapy- will await his levels and then see where we are Ordered echo again to further evaluate tachycardia and possible atrial enlargement on last EKG0- they will let me know if they elect not to do this again  Signed Abbe AmsterdamJessica Berlin Mokry, MD  5/18: Labs as below  Results for orders placed or performed in visit on 08/28/14  CBC   Result Value Ref Range   WBC 8.5 4.0 - 10.5 K/uL   RBC 5.63 4.22 - 5.81 MIL/uL   Hemoglobin 16.2 13.0 - 17.0 g/dL   HCT 09.845.0 11.939.0 - 14.752.0 %   MCV 79.9 78.0 - 100.0 fL   MCH 28.8 26.0 - 34.0 pg   MCHC 36.0 30.0 - 36.0 g/dL   RDW 82.913.8 56.211.5 - 13.015.5 %   Platelets 298 150 - 400 K/uL   MPV 11.4 8.6 -  12.4 fL  Comprehensive metabolic panel  Result Value Ref Range   Sodium 139 135 - 145 mEq/L   Potassium 3.9 3.5 - 5.3 mEq/L   Chloride 101 96 - 112 mEq/L   CO2 27 19 - 32 mEq/L   Glucose, Bld 96 70 - 99 mg/dL   BUN 12 6 - 23 mg/dL   Creat 4.09 8.11 - 9.14 mg/dL   Total Bilirubin 0.8 0.2 - 1.2 mg/dL   Alkaline Phosphatase 70 39 - 117 U/L   AST 22 0 - 37 U/L   ALT 39 0 - 53 U/L   Total Protein 7.1 6.0 - 8.3 g/dL   Albumin 3.9 3.5 - 5.2 g/dL   Calcium 9.8 8.4 - 78.2 mg/dL  Lipid panel  Result Value Ref Range   Cholesterol 187 0 - 200 mg/dL   Triglycerides 956 <213 mg/dL   HDL 45 >=08 mg/dL   Total CHOL/HDL Ratio 4.2 Ratio   VLDL 27 0 - 40 mg/dL   LDL Cholesterol 657 (H) 0 - 99 mg/dL  PSA  Result Value Ref Range   PSA 0.72 <=4.00 ng/mL  Testosterone, Free, Total, SHBG  Result Value Ref Range   Testosterone 117 (L) 300 - 890 ng/dL   Sex Hormone Binding 15 10 - 50 nmol/L   Testosterone, Free 31.6 (L) 47.0 - 244.0 pg/mL   Testosterone-% Free 2.7 1.6 - 2.9 %   His T is low, not polycythemic, labs OW ok.  He would like to go ahead and increase his testosterone therapy.  Will change to 100 mg of androgel daily. He will see me in September for a CPE and we can repeat labs then

## 2014-08-29 LAB — TESTOSTERONE, FREE, TOTAL, SHBG
SEX HORMONE BINDING: 15 nmol/L (ref 10–50)
Testosterone, Free: 31.6 pg/mL — ABNORMAL LOW (ref 47.0–244.0)
Testosterone-% Free: 2.7 % (ref 1.6–2.9)
Testosterone: 117 ng/dL — ABNORMAL LOW (ref 300–890)

## 2014-08-29 LAB — PSA: PSA: 0.72 ng/mL (ref <=4.00)

## 2014-08-30 MED ORDER — TESTOSTERONE 50 MG/5GM (1%) TD GEL: 10.0000 g | Freq: Every day | TRANSDERMAL | Status: DC

## 2014-08-30 NOTE — Addendum Note (Signed)
Addended by: Abbe AmsterdamOPLAND, Alethia Melendrez C on: 08/30/2014 05:29 PM   Modules accepted: Orders

## 2014-08-31 ENCOUNTER — Ambulatory Visit (HOSPITAL_COMMUNITY): Payer: Managed Care, Other (non HMO) | Attending: Cardiology

## 2014-08-31 ENCOUNTER — Other Ambulatory Visit: Payer: Self-pay

## 2014-08-31 DIAGNOSIS — Z6837 Body mass index (BMI) 37.0-37.9, adult: Secondary | ICD-10-CM | POA: Insufficient documentation

## 2014-08-31 DIAGNOSIS — E669 Obesity, unspecified: Secondary | ICD-10-CM | POA: Insufficient documentation

## 2014-08-31 DIAGNOSIS — R000 Tachycardia, unspecified: Secondary | ICD-10-CM

## 2014-08-31 DIAGNOSIS — R9431 Abnormal electrocardiogram [ECG] [EKG]: Secondary | ICD-10-CM

## 2014-08-31 DIAGNOSIS — Z87891 Personal history of nicotine dependence: Secondary | ICD-10-CM | POA: Insufficient documentation

## 2014-08-31 DIAGNOSIS — I10 Essential (primary) hypertension: Secondary | ICD-10-CM | POA: Insufficient documentation

## 2014-09-05 ENCOUNTER — Telehealth: Payer: Self-pay | Admitting: Family Medicine

## 2014-09-05 DIAGNOSIS — I77819 Aortic ectasia, unspecified site: Secondary | ICD-10-CM

## 2014-09-05 NOTE — Telephone Encounter (Signed)
Called regarding his recent echo, as follows:   Spoke with his wife who answered the phone and generally attends his office visits. They would like to proceed with further eval. I sent a message to Dr. Jens Somrenshaw, reply as follows:   Would order MRA of thoracic aorta with contrast to evaluate thoracic aortic aneurysm. If dilated on MRA, I would be happy to follow.  BC    Will order MRA for Brian Perkins- called MRI tech, told to order MRA chest with and without contrast

## 2014-09-26 ENCOUNTER — Other Ambulatory Visit: Payer: Self-pay | Admitting: Family Medicine

## 2014-11-10 ENCOUNTER — Ambulatory Visit (HOSPITAL_COMMUNITY)
Admission: RE | Admit: 2014-11-10 | Discharge: 2014-11-10 | Disposition: A | Discharge status: Home / Self Care | Payer: Managed Care, Other (non HMO) | Source: Ambulatory Visit | Attending: Family Medicine | Admitting: Family Medicine

## 2014-11-10 DIAGNOSIS — I77810 Thoracic aortic ectasia: Secondary | ICD-10-CM | POA: Diagnosis present

## 2014-11-10 DIAGNOSIS — I77819 Aortic ectasia, unspecified site: Secondary | ICD-10-CM

## 2014-11-10 LAB — CREATININE, SERUM
CREATININE: 1.18 mg/dL (ref 0.61–1.24)
GFR calc non Af Amer: >60 mL/min (ref >60)

## 2014-11-10 MED ORDER — GADOBENATE DIMEGLUMINE 529 MG/ML IV SOLN
20.0000 mL | Freq: Once | INTRAVENOUS | Status: AC | PRN
  Administered 2014-11-10: 20 mL via INTRAVENOUS

## 2014-12-28 ENCOUNTER — Other Ambulatory Visit: Payer: Self-pay | Admitting: Family Medicine

## 2015-01-01 ENCOUNTER — Encounter: Payer: Self-pay | Admitting: Family Medicine

## 2015-01-03 ENCOUNTER — Encounter: Payer: Self-pay | Admitting: Family Medicine

## 2015-02-21 ENCOUNTER — Other Ambulatory Visit: Payer: Self-pay | Admitting: Family Medicine

## 2015-02-21 NOTE — Telephone Encounter (Signed)
Dr. Patsy Lageropland patient; will forward to her.

## 2015-04-26 ENCOUNTER — Telehealth: Payer: Self-pay | Admitting: Physician Assistant

## 2015-04-26 DIAGNOSIS — B9689 Other specified bacterial agents as the cause of diseases classified elsewhere: Secondary | ICD-10-CM

## 2015-04-26 DIAGNOSIS — J208 Acute bronchitis due to other specified organisms: Principal | ICD-10-CM

## 2015-04-26 DIAGNOSIS — J00 Acute nasopharyngitis [common cold]: Secondary | ICD-10-CM

## 2015-04-26 MED ORDER — BENZONATATE 100 MG PO CAPS: 100.0000 mg | ORAL_CAPSULE | Freq: Three times a day (TID) | ORAL | Status: DC | PRN

## 2015-04-26 MED ORDER — AZITHROMYCIN 250 MG PO TABS: ORAL_TABLET | ORAL | Status: DC

## 2015-04-26 NOTE — Progress Notes (Signed)

## 2015-04-30 ENCOUNTER — Other Ambulatory Visit: Payer: Self-pay | Admitting: Family Medicine

## 2015-05-04 ENCOUNTER — Encounter: Payer: Self-pay | Admitting: Family Medicine

## 2015-05-09 ENCOUNTER — Encounter: Payer: Self-pay | Admitting: Family Medicine

## 2015-05-15 ENCOUNTER — Encounter: Payer: Self-pay | Admitting: Family Medicine

## 2015-07-26 ENCOUNTER — Telehealth: Payer: Self-pay

## 2015-07-26 NOTE — Telephone Encounter (Signed)
Chris @ [facility] tried to fax a request for   zolpidem (AMBIEN) 10 MG tablet [[id number]][[id number]]     Order Details    Dose, Route, Frequency: As Directed Order Comments:   Not to exceed 4 additional fills before 08/20/2015      Dispense Quantity:  30 tablet Refills:  1 Fills Remaining:  1          Sig: TAKE 1 TABLET BY MOUTH AT BEDTIME         Written Date:  05/01/15 Expiration Date:  10/28/15     Start Date:  05/01/15 End Date:  --     Ordering Provider:  -- Authorizing Provider:  Pearline CablesJessica C Copland, MD Ordering User:  Pearline CablesJessica C Copland, MD                   Original Order:  zolpidem (AMBIEN) 10 MG tablet [[id number]]        Pharmacy:  [id number] - [place], [place] - [address]      Pharmacy Comments:  --         Quantity Remaining:  30 tablet Quantity Filled:  0 tablet              Order Class     Phone In   He is requesting a refill  For the patient.

## 2015-07-27 NOTE — Telephone Encounter (Signed)
Left message for pt to call back.   Does he see Dr. Patsy Lageropland?

## 2015-08-03 ENCOUNTER — Other Ambulatory Visit: Payer: Self-pay

## 2015-08-03 DIAGNOSIS — I10 Essential (primary) hypertension: Secondary | ICD-10-CM

## 2015-08-03 MED ORDER — LISINOPRIL 40 MG PO TABS: 40.0000 mg | ORAL_TABLET | Freq: Every day | ORAL | Status: DC

## 2015-08-03 MED ORDER — AMLODIPINE BESYLATE 10 MG PO TABS: 10.0000 mg | ORAL_TABLET | Freq: Every day | ORAL | Status: DC

## 2015-08-03 MED ORDER — HYDROCHLOROTHIAZIDE 25 MG PO TABS: 25.0000 mg | ORAL_TABLET | Freq: Every day | ORAL | Status: DC

## 2015-08-07 ENCOUNTER — Encounter: Payer: Self-pay | Admitting: Family Medicine

## 2015-08-21 ENCOUNTER — Encounter: Payer: Self-pay | Admitting: Family Medicine

## 2015-08-21 DIAGNOSIS — G4700 Insomnia, unspecified: Secondary | ICD-10-CM

## 2015-08-22 MED ORDER — ZOLPIDEM TARTRATE 10 MG PO TABS: 10.0000 mg | ORAL_TABLET | Freq: Every day | ORAL | Status: DC

## 2015-09-05 ENCOUNTER — Other Ambulatory Visit: Payer: Self-pay | Admitting: Urgent Care

## 2015-09-13 ENCOUNTER — Other Ambulatory Visit: Payer: Self-pay | Admitting: Urgent Care

## 2015-09-13 NOTE — Telephone Encounter (Signed)
Patient will need to schedule office visit with next RF

## 2015-10-19 ENCOUNTER — Other Ambulatory Visit: Payer: Self-pay | Admitting: Urgent Care

## 2015-10-22 ENCOUNTER — Encounter: Payer: Self-pay | Admitting: Family Medicine

## 2015-10-22 ENCOUNTER — Other Ambulatory Visit: Payer: Self-pay | Admitting: Emergency Medicine

## 2015-10-22 DIAGNOSIS — I10 Essential (primary) hypertension: Secondary | ICD-10-CM

## 2015-10-22 MED ORDER — LISINOPRIL 40 MG PO TABS: ORAL_TABLET | ORAL | Status: DC

## 2015-10-22 MED ORDER — HYDROCHLOROTHIAZIDE 25 MG PO TABS: ORAL_TABLET | ORAL | Status: DC

## 2015-10-22 NOTE — Telephone Encounter (Signed)
Received refill request for Hydrochlorothiazide and Lisinopril.  Last OV: 08/28/14 Last refill: 09/13/2015  Is it ok to refill? Please advise. Thanks

## 2015-11-02 ENCOUNTER — Other Ambulatory Visit: Payer: Self-pay | Admitting: Urgent Care

## 2015-11-04 ENCOUNTER — Other Ambulatory Visit: Payer: Self-pay | Admitting: Urgent Care

## 2015-11-05 ENCOUNTER — Other Ambulatory Visit: Payer: Self-pay | Admitting: Family Medicine

## 2015-12-06 ENCOUNTER — Other Ambulatory Visit: Payer: Self-pay | Admitting: Family Medicine

## 2015-12-10 ENCOUNTER — Other Ambulatory Visit: Payer: Self-pay | Admitting: Family Medicine

## 2015-12-10 DIAGNOSIS — I10 Essential (primary) hypertension: Secondary | ICD-10-CM

## 2015-12-11 NOTE — Telephone Encounter (Signed)
Received refill request for Lisinopril and Hydrochlorothiazide. Pt has not been seen in the office since 08/28/14. Will refill for one month but additional refills will require an office visit.

## 2016-01-11 ENCOUNTER — Other Ambulatory Visit: Payer: Self-pay | Admitting: Family Medicine

## 2016-01-11 DIAGNOSIS — I10 Essential (primary) hypertension: Secondary | ICD-10-CM

## 2016-01-11 NOTE — Telephone Encounter (Signed)
Please schedule pt for annual exam with labs Thanks PC

## 2016-01-14 NOTE — Telephone Encounter (Signed)
LVM advising patient to call and schedule physical appointment.

## 2016-02-19 ENCOUNTER — Other Ambulatory Visit: Payer: Self-pay | Admitting: Family Medicine

## 2016-02-19 DIAGNOSIS — I10 Essential (primary) hypertension: Secondary | ICD-10-CM

## 2016-03-18 ENCOUNTER — Other Ambulatory Visit: Payer: Self-pay | Admitting: Family Medicine

## 2016-03-18 DIAGNOSIS — I10 Essential (primary) hypertension: Secondary | ICD-10-CM

## 2016-03-18 NOTE — Telephone Encounter (Signed)
lvm advising patient to schedule appointment °

## 2016-03-18 NOTE — Telephone Encounter (Signed)
Please schedule patient for annual exam  Thanks   PC

## 2016-04-27 ENCOUNTER — Encounter: Payer: Self-pay | Admitting: *Deleted

## 2016-04-27 ENCOUNTER — Emergency Department: Payer: BLUE CROSS/BLUE SHIELD

## 2016-04-27 ENCOUNTER — Observation Stay
Admission: EM | Admit: 2016-04-27 | Discharge: 2016-04-29 | DRG: 872 | Disposition: A | Discharge status: Home / Self Care | Payer: BLUE CROSS/BLUE SHIELD | Attending: Internal Medicine | Admitting: Internal Medicine

## 2016-04-27 DIAGNOSIS — Z6837 Body mass index (BMI) 37.0-37.9, adult: Secondary | ICD-10-CM | POA: Diagnosis not present

## 2016-04-27 DIAGNOSIS — Z87891 Personal history of nicotine dependence: Secondary | ICD-10-CM | POA: Diagnosis not present

## 2016-04-27 DIAGNOSIS — E872 Acidosis: Secondary | ICD-10-CM | POA: Diagnosis not present

## 2016-04-27 DIAGNOSIS — E876 Hypokalemia: Secondary | ICD-10-CM | POA: Diagnosis not present

## 2016-04-27 DIAGNOSIS — E860 Dehydration: Secondary | ICD-10-CM | POA: Diagnosis not present

## 2016-04-27 DIAGNOSIS — Z8249 Family history of ischemic heart disease and other diseases of the circulatory system: Secondary | ICD-10-CM

## 2016-04-27 DIAGNOSIS — Z79899 Other long term (current) drug therapy: Secondary | ICD-10-CM | POA: Diagnosis not present

## 2016-04-27 DIAGNOSIS — Z7982 Long term (current) use of aspirin: Secondary | ICD-10-CM

## 2016-04-27 DIAGNOSIS — Z833 Family history of diabetes mellitus: Secondary | ICD-10-CM

## 2016-04-27 DIAGNOSIS — T502X5A Adverse effect of carbonic-anhydrase inhibitors, benzothiadiazides and other diuretics, initial encounter: Secondary | ICD-10-CM | POA: Diagnosis present

## 2016-04-27 DIAGNOSIS — Z7951 Long term (current) use of inhaled steroids: Secondary | ICD-10-CM

## 2016-04-27 DIAGNOSIS — E669 Obesity, unspecified: Secondary | ICD-10-CM | POA: Diagnosis present

## 2016-04-27 DIAGNOSIS — A419 Sepsis, unspecified organism: Principal | ICD-10-CM | POA: Diagnosis present

## 2016-04-27 DIAGNOSIS — R42 Dizziness and giddiness: Secondary | ICD-10-CM | POA: Diagnosis present

## 2016-04-27 DIAGNOSIS — J208 Acute bronchitis due to other specified organisms: Secondary | ICD-10-CM

## 2016-04-27 DIAGNOSIS — A0819 Acute gastroenteropathy due to other small round viruses: Secondary | ICD-10-CM | POA: Diagnosis not present

## 2016-04-27 DIAGNOSIS — I10 Essential (primary) hypertension: Secondary | ICD-10-CM | POA: Diagnosis present

## 2016-04-27 DIAGNOSIS — B9689 Other specified bacterial agents as the cause of diseases classified elsewhere: Secondary | ICD-10-CM

## 2016-04-27 LAB — URINALYSIS, COMPLETE (UACMP) WITH MICROSCOPIC
BILIRUBIN URINE: NEGATIVE
Glucose, UA: NEGATIVE mg/dL
Hgb urine dipstick: NEGATIVE
KETONES UR: 5 mg/dL — AB
LEUKOCYTES UA: NEGATIVE
Nitrite: NEGATIVE
PH: 7 (ref 5.0–8.0)
Protein, ur: NEGATIVE mg/dL
SQUAMOUS EPITHELIAL / LPF: NONE SEEN
Specific Gravity, Urine: 1.018 (ref 1.005–1.030)

## 2016-04-27 LAB — MAGNESIUM: MAGNESIUM: 1.5 mg/dL — AB (ref 1.7–2.4)

## 2016-04-27 LAB — CBC
HEMATOCRIT: 47.2 % (ref 40.0–52.0)
Hemoglobin: 16.6 g/dL (ref 13.0–18.0)
MCH: 28.3 pg (ref 26.0–34.0)
MCHC: 35.2 g/dL (ref 32.0–36.0)
MCV: 80.4 fL (ref 80.0–100.0)
Platelets: 294 10*3/uL (ref 150–440)
RBC: 5.88 MIL/uL (ref 4.40–5.90)
RDW: 13.4 % (ref 11.5–14.5)
WBC: 17 10*3/uL — AB (ref 3.8–10.6)

## 2016-04-27 LAB — BASIC METABOLIC PANEL
Anion gap: 11 (ref 5–15)
BUN: 14 mg/dL (ref 6–20)
CHLORIDE: 102 mmol/L (ref 101–111)
CO2: 25 mmol/L (ref 22–32)
Calcium: 9.2 mg/dL (ref 8.9–10.3)
Creatinine, Ser: 1.15 mg/dL (ref 0.61–1.24)
GFR calc non Af Amer: >60 mL/min (ref >60)
Glucose, Bld: 116 mg/dL — ABNORMAL HIGH (ref 65–99)
POTASSIUM: 3.1 mmol/L — AB (ref 3.5–5.1)
SODIUM: 138 mmol/L (ref 135–145)

## 2016-04-27 LAB — PROTIME-INR
INR: 1.1
Prothrombin Time: 14.2 seconds (ref 11.4–15.2)

## 2016-04-27 LAB — INFLUENZA PANEL BY PCR (TYPE A & B)
INFLAPCR: NEGATIVE
Influenza B By PCR: NEGATIVE

## 2016-04-27 LAB — LACTIC ACID, PLASMA
LACTIC ACID, VENOUS: 2.9 mmol/L — AB (ref 0.5–1.9)
Lactic Acid, Venous: 3.1 mmol/L (ref 0.5–1.9)
Lactic Acid, Venous: 2 mmol/L (ref 0.5–1.9)
Lactic Acid, Venous: 1.5 mmol/L (ref 0.5–1.9)

## 2016-04-27 LAB — APTT: APTT: 31 s (ref 24–36)

## 2016-04-27 LAB — TROPONIN I

## 2016-04-27 LAB — PROCALCITONIN: Procalcitonin: 0.31 ng/mL

## 2016-04-27 MED ORDER — PANTOPRAZOLE SODIUM 40 MG PO TBEC
40.0000 mg | DELAYED_RELEASE_TABLET | Freq: Every day | ORAL | Status: DC
  Administered 2016-04-27 – 2016-04-29 (×3): 40 mg via ORAL
  Filled 2016-04-27 (×3): qty 1

## 2016-04-27 MED ORDER — ACETAMINOPHEN 325 MG PO TABS: 650.0000 mg | ORAL_TABLET | Freq: Four times a day (QID) | ORAL | Status: DC | PRN

## 2016-04-27 MED ORDER — POTASSIUM CHLORIDE CRYS ER 20 MEQ PO TBCR
40.0000 meq | EXTENDED_RELEASE_TABLET | Freq: Once | ORAL | Status: AC
  Administered 2016-04-27: 40 meq via ORAL
  Filled 2016-04-27: qty 2

## 2016-04-27 MED ORDER — SODIUM CHLORIDE 0.9% FLUSH
3.0000 mL | Freq: Two times a day (BID) | INTRAVENOUS | Status: DC
  Administered 2016-04-27 – 2016-04-28 (×2): 3 mL via INTRAVENOUS

## 2016-04-27 MED ORDER — VANCOMYCIN HCL IN DEXTROSE 1-5 GM/200ML-% IV SOLN
1000.0000 mg | Freq: Once | INTRAVENOUS | Status: AC
  Administered 2016-04-27: 1000 mg via INTRAVENOUS
  Filled 2016-04-27: qty 200

## 2016-04-27 MED ORDER — KETOROLAC TROMETHAMINE 30 MG/ML IJ SOLN: 30.0000 mg | Freq: Four times a day (QID) | INTRAMUSCULAR | Status: DC | PRN

## 2016-04-27 MED ORDER — HYDROCODONE-ACETAMINOPHEN 5-325 MG PO TABS
1.0000 | ORAL_TABLET | ORAL | Status: DC | PRN
Start: 2016-04-27 — End: 2016-04-28
  Administered 2016-04-28: 1 via ORAL
  Filled 2016-04-27: qty 1

## 2016-04-27 MED ORDER — CEFTRIAXONE SODIUM-DEXTROSE 1-3.74 GM-% IV SOLR
1.0000 g | INTRAVENOUS | Status: DC
  Administered 2016-04-27 – 2016-04-28 (×2): 1 g via INTRAVENOUS
  Filled 2016-04-27 (×3): qty 50

## 2016-04-27 MED ORDER — DEXTROSE 5 % IV SOLN
500.0000 mg | INTRAVENOUS | Status: DC
  Administered 2016-04-27 – 2016-04-28 (×2): 500 mg via INTRAVENOUS
  Filled 2016-04-27 (×3): qty 500

## 2016-04-27 MED ORDER — DEXTROSE 5 % IV SOLN
500.0000 mg | Freq: Once | INTRAVENOUS | Status: DC
  Filled 2016-04-27: qty 500

## 2016-04-27 MED ORDER — OMEPRAZOLE MAGNESIUM 20 MG PO TBEC: 20.0000 mg | DELAYED_RELEASE_TABLET | Freq: Every day | ORAL | Status: DC

## 2016-04-27 MED ORDER — ONDANSETRON HCL 4 MG PO TABS: 4.0000 mg | ORAL_TABLET | Freq: Four times a day (QID) | ORAL | Status: DC | PRN

## 2016-04-27 MED ORDER — AMLODIPINE BESYLATE 10 MG PO TABS
10.0000 mg | ORAL_TABLET | Freq: Every day | ORAL | Status: DC
  Administered 2016-04-28 – 2016-04-29 (×2): 10 mg via ORAL
  Filled 2016-04-27 (×3): qty 1

## 2016-04-27 MED ORDER — SENNOSIDES-DOCUSATE SODIUM 8.6-50 MG PO TABS: 1.0000 | ORAL_TABLET | Freq: Every evening | ORAL | Status: DC | PRN

## 2016-04-27 MED ORDER — POTASSIUM CHLORIDE IN NACL 20-0.9 MEQ/L-% IV SOLN
INTRAVENOUS | Status: DC
  Administered 2016-04-27 – 2016-04-29 (×4): via INTRAVENOUS
  Filled 2016-04-27 (×6): qty 1000

## 2016-04-27 MED ORDER — LISINOPRIL 20 MG PO TABS
40.0000 mg | ORAL_TABLET | Freq: Every day | ORAL | Status: DC
  Administered 2016-04-28 – 2016-04-29 (×2): 40 mg via ORAL
  Filled 2016-04-27 (×3): qty 2

## 2016-04-27 MED ORDER — ENOXAPARIN SODIUM 40 MG/0.4ML ~~LOC~~ SOLN
40.0000 mg | SUBCUTANEOUS | Status: DC
  Administered 2016-04-27 – 2016-04-28 (×2): 40 mg via SUBCUTANEOUS
  Filled 2016-04-27 (×2): qty 0.4

## 2016-04-27 MED ORDER — ONDANSETRON HCL 4 MG/2ML IJ SOLN
4.0000 mg | Freq: Once | INTRAMUSCULAR | Status: AC
  Administered 2016-04-27: 4 mg via INTRAVENOUS
  Filled 2016-04-27: qty 2

## 2016-04-27 MED ORDER — ACETAMINOPHEN 650 MG RE SUPP: 650.0000 mg | Freq: Four times a day (QID) | RECTAL | Status: DC | PRN

## 2016-04-27 MED ORDER — BISACODYL 5 MG PO TBEC: 5.0000 mg | DELAYED_RELEASE_TABLET | Freq: Every day | ORAL | Status: DC | PRN

## 2016-04-27 MED ORDER — PIPERACILLIN-TAZOBACTAM 3.375 G IVPB 30 MIN
3.3750 g | Freq: Once | INTRAVENOUS | Status: AC
  Administered 2016-04-27: 3.375 g via INTRAVENOUS
  Filled 2016-04-27: qty 50

## 2016-04-27 MED ORDER — ASPIRIN EC 81 MG PO TBEC
81.0000 mg | DELAYED_RELEASE_TABLET | Freq: Every day | ORAL | Status: DC
  Administered 2016-04-27 – 2016-04-29 (×3): 81 mg via ORAL
  Filled 2016-04-27 (×3): qty 1

## 2016-04-27 MED ORDER — ONDANSETRON HCL 4 MG/2ML IJ SOLN: 4.0000 mg | Freq: Four times a day (QID) | INTRAMUSCULAR | Status: DC | PRN

## 2016-04-27 MED ORDER — ASPIRIN 81 MG PO TABS: 81.0000 mg | ORAL_TABLET | Freq: Every day | ORAL | Status: DC

## 2016-04-27 MED ORDER — ALBUTEROL SULFATE (2.5 MG/3ML) 0.083% IN NEBU: 2.5000 mg | INHALATION_SOLUTION | RESPIRATORY_TRACT | Status: DC | PRN

## 2016-04-27 MED ORDER — DEXTROSE 5 % IV SOLN: 1.0000 g | Freq: Once | INTRAVENOUS | Status: DC

## 2016-04-27 NOTE — ED Notes (Signed)
Patient's only complaint at this time is generalized and overwhelming weakness

## 2016-04-27 NOTE — Progress Notes (Signed)
Pharmacy Antibiotic Note  Modesta Messingrevor L Boley is a 51 y.o. male admitted on 04/27/2016 with  pneumonia and Sepsis.  Pharmacy has been consulted for ceftriaxone and azithromycin dosing.  Plan: Will start patient on Azithromycin 500mg  every 24 hours and Ceftriaxone 1gm IV every 24 hours.  Switch IV- PO when patient clinically stable.   Height: 6' (182.9 cm) Weight: 280 lb (127 kg) IBW/kg (Calculated) : 77.6  Temp (24hrs), Avg:98.5 F (36.9 C), Min:97.4 F (36.3 C), Max:99.5 F (37.5 C)   Recent Labs Lab 04/27/16 1005 04/27/16 1154 04/27/16 1503  WBC 17.0*  --   --   CREATININE 1.15  --   --   LATICACIDVEN  --  3.1* 2.9*    Estimated Creatinine Clearance: 105.9 mL/min (by C-G formula based on SCr of 1.15 mg/dL).    No Known Allergies  Antimicrobials this admission: 1/14 Zosyn and Vancomycin>> x1 dose 1/14 azithromycin  >>  1/14 Ceftriaxone >>  Dose adjustments this admission:  Microbiology results:  1/14 BCx:    Thank you for allowing pharmacy to be a part of this patient's care.  Gardner CandleSheema M Shahir Karen, PharmD, BCPS Clinical Pharmacist 04/27/2016 5:16 PM

## 2016-04-27 NOTE — ED Provider Notes (Signed)
[facility] Emergency Department Provider Note ____________________________________________   I have reviewed the triage vital signs and the triage nursing note.  HISTORY  Chief Complaint Dizziness   Historian Somewhat limited history due to patient is feeling extremely unwell.  HPI Brian Perkins is a 51 y.o. 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  No chest pain. No shortness of breath. He has not recently been coughing. He has potentially been exposed to the flu working here in the emergency department.  No palpitations. No history of passing out.    Past Medical History:  Diagnosis Date  . Allergy   . Hypertension   . Hypogonadism male     Patient Active Problem List   Diagnosis Date Noted  . Obesity 08/28/2014  . Hypogonadism male 07/21/2013  . Essential hypertension, benign 07/11/2011    Past Surgical History:  Procedure Laterality Date  . CHOLECYSTECTOMY      Prior to Admission medications   Medication Sig Start Date End Date Taking? Authorizing Provider  amLODipine (NORVASC) 10 MG tablet TAKE 1 TABLET (10 MG TOTAL) BY MOUTH DAILY. 03/18/16  Yes Gwenlyn Found Copland, MD  aspirin 81 MG tablet Take 81 mg by mouth daily.   Yes Historical Provider, MD  Fish Oil OIL by Does not apply route daily.   Yes Historical Provider, MD  Glucosamine HCl-MSM (GLUCOSAMINE-MSM PO) Take 1,000 mg by mouth daily.   Yes Historical Provider, MD  hydrochlorothiazide (HYDRODIURIL) 25 MG tablet TAKE 1 TABLET (25 MG TOTAL) BY MOUTH DAILY. 03/18/16  Yes Jessica C Copland, MD  lisinopril (PRINIVIL,ZESTRIL) 40 MG tablet TAKE 1 TABLET (40 MG TOTAL) BY MOUTH DAILY. 03/18/16  Yes Gwenlyn Found Copland, MD  omeprazole (PRILOSEC OTC) 20 MG tablet Take 20 mg by mouth daily.   Yes Historical Provider, MD  Triamcinolone Acetonide (NASACORT AQ NA) Place into the nose.   Yes  Historical Provider, MD  azithromycin (ZITHROMAX) 250 MG tablet Take 2 tablets on Day 1. Then take 1 tablet daily. 04/26/15   Waldon Merl, PA-C  benzonatate (TESSALON) 100 MG capsule Take 1-2 capsules (100-200 mg total) by mouth 3 (three) times daily as needed for cough. Patient not taking: Reported on 04/27/2016 04/26/15   Waldon Merl, PA-C  fluticasone [facility]) 50 MCG/ACT nasal spray Place 2 sprays into the nose daily. 09/03/11 09/02/12  Anders Simmonds, PA-C  testosterone (ANDROGEL) 50 MG/5GM (1%) GEL Place 10 g onto the skin daily. Patient not taking: Reported on 04/27/2016 08/30/14   Gwenlyn Found Copland, MD  zolpidem (AMBIEN) 10 MG tablet Take 1 tablet (10 mg total) by mouth at bedtime. Use as needed for insomnia Patient not taking: Reported on 04/27/2016 08/22/15   Pearline Cables, MD    No Known Allergies  Family History  Problem Relation Age of Onset  . Diabetes Mother   . Hyperlipidemia Father   . Hypertension Father     Social History Social History  Substance Use Topics  . Smoking status: Former Smoker    Types: Cigarettes, Cigars    Quit date: 04/14/1989  . Smokeless tobacco: Not on file     Comment: not much of smoker  . Alcohol use Yes     Comment: rarely 1 or 2 beers    Review of Systems  Constitutional: Negative for fever. Eyes: Negative for visual changes. ENT: Negative for sore  throat. Cardiovascular: Negative for chest pain. Respiratory: Negative for shortness of breath. Gastrointestinal: Negative for abdominal pain, vomiting and diarrhea. Genitourinary: Negative for dysuria. Musculoskeletal: Negative for back pain. Skin: Negative for rash. Neurological: Negative for headache. 10 point Review of Systems otherwise negative ____________________________________________   PHYSICAL EXAM:  VITAL SIGNS: ED Triage Vitals  Enc Vitals Group     BP 04/27/16 1002 138/85     Pulse Rate 04/27/16 1002 98     Resp 04/27/16 1002 20     Temp 04/27/16 1002  97.4 F (36.3 C)     Temp Source 04/27/16 1002 Oral     SpO2 04/27/16 1002 97 %     Weight 04/27/16 1005 280 lb (127 kg)     Height 04/27/16 1005 6' (1.829 m)     Head Circumference --      Peak Flow --      Pain Score --      Pain Loc --      Pain Edu? --      Excl. in GC? --      Constitutional: Alert and Cooperative, but pale and looks he doesn't feel well.  HEENT   Head: Normocephalic and atraumatic.      Eyes: Conjunctivae are normal. PERRL. Normal extraocular movements.      Ears:         Nose: No congestion/rhinnorhea.   Mouth/Throat: Mucous membranes are moist.   Neck: No stridor. Cardiovascular/Chest: Normal rate, regular rhythm.  No murmurs, rubs, or gallops. Respiratory: Normal respiratory effort without tachypnea nor retractions. Breath sounds are clear and equal bilaterally. No wheezes/rales/rhonchi. Gastrointestinal: Soft. No distention, no guarding, no rebound. Nontender.  Genitourinary/rectal:Deferred Musculoskeletal: Nontender with normal range of motion in all extremities. No joint effusions.  No lower extremity tenderness.  No edema. Neurologic:  Normal speech and language. No gross or focal neurologic deficits are appreciated. Skin:  Skin is warm, dry and intact. No rash noted. Psychiatric: Mood and affect are normal. Speech and behavior are normal. Patient exhibits appropriate insight and judgment.   ____________________________________________  LABS (pertinent positives/negatives)  Labs Reviewed  BASIC METABOLIC PANEL - Abnormal; Notable for the following:       Result Value   Potassium 3.1 (*)    Glucose, Bld 116 (*)    All other components within normal limits  CBC - Abnormal; Notable for the following:    WBC 17.0 (*)    All other components within normal limits  URINALYSIS, COMPLETE (UACMP) WITH MICROSCOPIC - Abnormal; Notable for the following:    Color, Urine YELLOW (*)    APPearance CLEAR (*)    Ketones, ur 5 (*)    Bacteria, UA  RARE (*)    All other components within normal limits  LACTIC ACID, PLASMA - Abnormal; Notable for the following:    Lactic Acid, Venous 3.1 (*)    All other components within normal limits  CULTURE, BLOOD (ROUTINE X 2)  CULTURE, BLOOD (ROUTINE X 2)  TROPONIN I  INFLUENZA PANEL BY PCR (TYPE A & B, H1N1)  LACTIC ACID, PLASMA  CBG MONITORING, ED    ____________________________________________    EKG I, Governor Rooks, MD, the attending physician have personally viewed and interpreted all ECGs.  90 bpm. Normal sinus rhythm. Narrow QRS. Normal axis. Normal ST and T-wave ____________________________________________  RADIOLOGY All Xrays were viewed by me. Imaging interpreted by Radiologist.  Chest x-ray two-view:  IMPRESSION: 1. Mild cardiomegaly. 2. Mild linear atelectasis or scarring the left lung  base. __________________________________________  PROCEDURES  Procedure(s) performed: None  Critical Care performed: None  ____________________________________________   ED COURSE / ASSESSMENT AND PLAN  Pertinent labs & imaging results that were available during my care of the patient were reviewed by me and considered in my medical decision making (see chart for details).   Mr. Brian Perkins was on duty here in the emergency department when he started to feel bad all over, generalized weakness, mild nausea, feeling like he might pass out.  Nurse took his vital signs showing fever 100.7 oral, heart rate 112, blood pressure in the 140s, and I went to check him out and asked him to check in to the emergency department. On triage vital signs here afebrile, heart rate in the 90s, again good blood pressure with no hypotension.  Nausea and somewhat pale and diaphoretic without chest pain or abdominal pain or focal neurologic deficit.  Initial EKG shows no STEMI.  Influenza test negative. On laboratory studies elevated white blood cell count, I will add blood cultures and lactate as well  as chest x-ray and urinalysis for other sources of possible infection.  The little unclear with the initial temperature showing mild fever, but the immediate repeat showing normal whether or not he is actually febrile. In any case, he still looks like he has rigors and doesn't feel well.  My impression is more infectious than ACS at this point, or cva, or intra abd emergency.  Lactate came back elevated at 3. He is receiving a second liter fluid. He's had no hypotension. I'm going to follow fluid resuscitation clinically.  He continues to look poorly overall in terms of pale and rigors.  Chest x-ray to me looks like possible left lower lobe pneumonia and his O2 sat is 93% so I'm suspicious that that is the source. Read by radiology as atelectasis versus scarring.  Given some tachycardia, plus a high white blood cell count, his low O2 sat, and he continues to feel unwell, I covered him with a vancomycin and Zosyn for sepsis of undetermined cause, blood cultures have been sent. I am going to admit him for observation as he still continues to look unwell.    CONSULTATIONS:  Hospitalist for admission.   Patient / Family / Caregiver informed of clinical course, medical decision-making process, and agree with plan.    ___________________________________________   FINAL CLINICAL IMPRESSION(S) / ED DIAGNOSES   Final diagnoses:  Sepsis, due to unspecified organism [facility](HCC)              Note: This dictation was prepared with Dragon dictation. Any transcriptional errors that result from this process are unintentional    Governor Rooksebecca Meredith Kilbride, MD 04/27/16 1411

## 2016-04-27 NOTE — Progress Notes (Addendum)
Pt has had 2 further episodes of watery diarrhea. Unable to obtain a sample due to pt did not make it to the BR. Pt also declined BP meds due to diastolic BP lower than normal. Report given to oncoming shift. Family at bedside, updated and education provided for enteric precautions.

## 2016-04-27 NOTE — ED Notes (Signed)
All patient's belongings placed in belongings bag in front of patient including: cell phone, wallet, and work uniform (shirt, badge, pants, boots, and bulletproof vest). Glasses given to patient directly.

## 2016-04-27 NOTE — ED Triage Notes (Signed)
Pt had a sudden onset of dizziness , generalized weakness, nausea, pt is pale and diaphoretic, pt denies chest pain

## 2016-04-27 NOTE — Progress Notes (Signed)
Pt admitted to 1A orthopdicas room 142. VS stable at this time. Attempted to contact pt's wife, unable to leave message. Pt comfortable, in no distress at this time. Will continue to monitor.

## 2016-04-27 NOTE — Progress Notes (Signed)
CRITICAL VALUE ALERT  Critical value received:  Lactic Acid 2.0   Date of notification:  04/27/2016  Time of notification:  2035  Critical value read back:Yes.    Nurse who received alert:  Julious OkaKarimah Abdussalaam RN   MD notified (1st page):  Dr. Anne HahnWillis  Time of first page:  2038  MD notified (2nd page):  Time of second page:  Responding MD:    Time MD responded:

## 2016-04-27 NOTE — H&P (Signed)
[facility] - [place] at [facility]   PATIENT NAME: Brian Perkins    MR#:  [id number]  DATE OF BIRTH:  [birth year]  DATE OF ADMISSION:  04/27/2016  PRIMARY CARE PHYSICIAN: Abbe AmsterdamOPLAND,JESSICA, MD   REQUESTING/REFERRING PHYSICIAN: Governor Rooksebecca Lord, MD  CHIEF COMPLAINT:   Chief Complaint  Patient presents with  . Dizziness   Dizziness, almost passed out and nausea today. HISTORY OF PRESENT ILLNESS:  Brian Perkins  is a 51 y.o. male with a known history of Hypertension and hypogonadism. 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 She started to feel lightheaded, nausea and almost passed out. He denies any cough, sputum or shortness of breath. He was found to have a fever 100.7, tachycardia at 140s and leukocytosis at 17,000. Chest x-ray show left sided infiltrate or atelectasis. Influenza is negative. PAST MEDICAL HISTORY:   Past Medical History:  Diagnosis Date  . Allergy   . Hypertension   . Hypogonadism male     PAST SURGICAL HISTORY:   Past Surgical History:  Procedure Laterality Date  . CHOLECYSTECTOMY      SOCIAL HISTORY:   Social History  Substance Use Topics  . Smoking status: Former Smoker    Types: Cigarettes, Cigars    Quit date: 04/14/1989  . Smokeless tobacco: Not on file     Comment: not much of smoker  . Alcohol use Yes     Comment: rarely 1 or 2 beers    FAMILY HISTORY:   Family History  Problem Relation Age of Onset  . Diabetes Mother   . Hyperlipidemia Father   . Hypertension Father     DRUG ALLERGIES:  No Known Allergies  REVIEW OF SYSTEMS:   Review of Systems  Constitutional: Positive for chills, diaphoresis, fever and malaise/fatigue.  HENT: Positive for sore throat. Negative for congestion.   Eyes: Negative for blurred vision and double vision.  Respiratory: Negative for cough, sputum production, shortness of breath, wheezing and stridor.   Cardiovascular: Negative for chest pain and leg swelling.    Gastrointestinal: Positive for nausea. Negative for abdominal pain, blood in stool, diarrhea, melena and vomiting.  Genitourinary: Negative for dysuria, frequency and hematuria.  Musculoskeletal: Negative for joint pain.  Skin: Negative for itching and rash.  Neurological: Positive for weakness. Negative for dizziness, focal weakness and loss of consciousness.  Psychiatric/Behavioral: Negative for depression. The patient is not nervous/anxious.     MEDICATIONS AT HOME:   Prior to Admission medications   Medication Sig Start Date End Date Taking? Authorizing Provider  amLODipine (NORVASC) 10 MG tablet TAKE 1 TABLET (10 MG TOTAL) BY MOUTH DAILY. 03/18/16  Yes Gwenlyn FoundJessica C Copland, MD  aspirin 81 MG tablet Take 81 mg by mouth daily.   Yes Historical Provider, MD  Fish Oil OIL by Does not apply route daily.   Yes Historical Provider, MD  Glucosamine HCl-MSM (GLUCOSAMINE-MSM PO) Take 1,000 mg by mouth daily.   Yes Historical Provider, MD  hydrochlorothiazide (HYDRODIURIL) 25 MG tablet TAKE 1 TABLET (25 MG TOTAL) BY MOUTH DAILY. 03/18/16  Yes Jessica C Copland, MD  lisinopril (PRINIVIL,ZESTRIL) 40 MG tablet TAKE 1 TABLET (40 MG TOTAL) BY MOUTH DAILY. 03/18/16  Yes Gwenlyn FoundJessica C Copland, MD  omeprazole (PRILOSEC OTC) 20 MG tablet Take 20 mg by mouth daily.   Yes Historical Provider, MD  Triamcinolone Acetonide (NASACORT AQ NA) Place into the nose.   Yes Historical Provider, MD  azithromycin (ZITHROMAX) 250 MG tablet Take 2 tablets on  Day 1. Then take 1 tablet daily. 04/26/15   Waldon Merl, PA-C  benzonatate (TESSALON) 100 MG capsule Take 1-2 capsules (100-200 mg total) by mouth 3 (three) times daily as needed for cough. Patient not taking: Reported on 04/27/2016 04/26/15   Waldon Merl, PA-C  fluticasone [facility]) 50 MCG/ACT nasal spray Place 2 sprays into the nose daily. 09/03/11 09/02/12  Anders Simmonds, PA-C  testosterone (ANDROGEL) 50 MG/5GM (1%) GEL Place 10 g onto the skin daily. Patient not  taking: Reported on 04/27/2016 08/30/14   Gwenlyn Found Copland, MD  zolpidem (AMBIEN) 10 MG tablet Take 1 tablet (10 mg total) by mouth at bedtime. Use as needed for insomnia Patient not taking: Reported on 04/27/2016 08/22/15   Gwenlyn Found Copland, MD      VITAL SIGNS:  Blood pressure (!) 146/92, pulse (!) 112, temperature 97.4 F (36.3 C), temperature source Oral, resp. rate 19, height 6' (1.829 m), weight 280 lb (127 kg), SpO2 97 %.  PHYSICAL EXAMINATION:  Physical Exam  GENERAL:  51 y.o.-year-old patient lying in the bed with no acute distress. Obese. EYES: Pupils equal, round, reactive to light and accommodation. No scleral icterus. Extraocular muscles intact.  HEENT: Head atraumatic, normocephalic. Oropharynx and nasopharynx clear.  NECK:  Supple, no jugular venous distention. No thyroid enlargement, no tenderness.  LUNGS: Normal breath sounds bilaterally, no wheezing, rales,rhonchi or crepitation. No use of accessory muscles of respiration.  CARDIOVASCULAR: S1, S2 normal. No murmurs, rubs, or gallops.  ABDOMEN: Soft, nontender, nondistended. Bowel sounds present. No organomegaly or mass.  EXTREMITIES: No pedal edema, cyanosis, or clubbing.  NEUROLOGIC: Cranial nerves II through XII are intact. Muscle strength 5/5 in all extremities. Sensation intact. Gait not checked.  PSYCHIATRIC: The patient is alert and oriented x 3.  SKIN: No obvious rash, lesion, or ulcer.   LABORATORY PANEL:   CBC  Recent Labs Lab 04/27/16 1005  WBC 17.0*  HGB 16.6  HCT 47.2  PLT 294   ------------------------------------------------------------------------------------------------------------------  Chemistries   Recent Labs Lab 04/27/16 1005  NA 138  K 3.1*  CL 102  CO2 25  GLUCOSE 116*  BUN 14  CREATININE 1.15  CALCIUM 9.2   ------------------------------------------------------------------------------------------------------------------  Cardiac Enzymes  Recent Labs Lab 04/27/16 1005    TROPONINI <0.03   ------------------------------------------------------------------------------------------------------------------  RADIOLOGY:  Dg Chest 2 View  Result Date: 04/27/2016 CLINICAL DATA:  Sudden onset syncope. EXAM: CHEST  2 VIEW COMPARISON:  Chest MRA dated 11/10/2014. FINDINGS: Mildly enlarged cardiac silhouette. Linear density at the left lung base. 2 adjacent calcified granulomata in the left mid lung zone. Clear right lung. Mildly tortuous aorta. Thoracic spine degenerative changes. IMPRESSION: 1. Mild cardiomegaly. 2. Mild linear atelectasis or scarring the left lung base. Electronically Signed   By: Beckie Salts M.D.   On: 04/27/2016 12:54      IMPRESSION AND PLAN:   Sepsis with pneumonia. The patient will be admitted to medical floor. He was treated with vancomycin and Zosyn in the ED, I will start this max and Rocephin, follow-up CBC and the cultures.  Lactic acidosis. Continue antibiotics and IV fluid support, and follow-up next the acid.  Hypokalemia. Give potassium supplement, follow-up potassium and magnesium level.  Hypertension. Continue lisinopril and Norvasc, but hold HCTZ.   All the records are reviewed and case discussed with ED provider. Management plans discussed with the patient, family and they are in agreement.  CODE STATUS: Full code  TOTAL TIME TAKING CARE OF THIS PATIENT: 55 minutes.  Shaune Pollack M.D on 04/27/2016 at 3:41 PM  Between 7am to 6pm - Pager - [phone]  After 6pm go to [email or web address] - Social research officer, government  [facility] [place] [facility]  Office  [phone]  CC: Primary care physician; Abbe Amsterdam, MD   Note: This dictation was prepared with Dragon dictation along with smaller phrase technology. Any transcriptional errors that result from this process are unintentional.

## 2016-04-28 LAB — GASTROINTESTINAL PANEL BY PCR, STOOL (REPLACES STOOL CULTURE)
ASTROVIRUS: NOT DETECTED
Adenovirus F40/41: NOT DETECTED
CAMPYLOBACTER SPECIES: NOT DETECTED
Cryptosporidium: NOT DETECTED
Cyclospora cayetanensis: NOT DETECTED
ENTAMOEBA HISTOLYTICA: NOT DETECTED
ENTEROTOXIGENIC E COLI (ETEC): NOT DETECTED
Enteroaggregative E coli (EAEC): NOT DETECTED
Enteropathogenic E coli (EPEC): NOT DETECTED
Giardia lamblia: NOT DETECTED
NOROVIRUS GI/GII: DETECTED — AB
PLESIMONAS SHIGELLOIDES: NOT DETECTED
Rotavirus A: NOT DETECTED
SAPOVIRUS (I, II, IV, AND V): NOT DETECTED
Salmonella species: NOT DETECTED
Shiga like toxin producing E coli (STEC): NOT DETECTED
Shigella/Enteroinvasive E coli (EIEC): DETECTED — AB
VIBRIO CHOLERAE: NOT DETECTED
Vibrio species: NOT DETECTED
Yersinia enterocolitica: NOT DETECTED

## 2016-04-28 LAB — BASIC METABOLIC PANEL
Anion gap: 7 (ref 5–15)
BUN: 12 mg/dL (ref 6–20)
CALCIUM: 7.9 mg/dL — AB (ref 8.9–10.3)
CO2: 23 mmol/L (ref 22–32)
CREATININE: 1.09 mg/dL (ref 0.61–1.24)
Chloride: 105 mmol/L (ref 101–111)
GFR calc Af Amer: >60 mL/min (ref >60)
GLUCOSE: 119 mg/dL — AB (ref 65–99)
Potassium: 3 mmol/L — ABNORMAL LOW (ref 3.5–5.1)
Sodium: 135 mmol/L (ref 135–145)

## 2016-04-28 LAB — C DIFFICILE QUICK SCREEN W PCR REFLEX
C DIFFICLE (CDIFF) ANTIGEN: NEGATIVE
C Diff interpretation: NOT DETECTED
C Diff toxin: NEGATIVE

## 2016-04-28 LAB — CBC
HCT: 40.4 % (ref 40.0–52.0)
Hemoglobin: 14.4 g/dL (ref 13.0–18.0)
MCH: 29 pg (ref 26.0–34.0)
MCHC: 35.7 g/dL (ref 32.0–36.0)
MCV: 81.3 fL (ref 80.0–100.0)
PLATELETS: 215 10*3/uL (ref 150–440)
RBC: 4.97 MIL/uL (ref 4.40–5.90)
RDW: 13.7 % (ref 11.5–14.5)
WBC: 10.5 10*3/uL (ref 3.8–10.6)

## 2016-04-28 MED ORDER — POTASSIUM CHLORIDE CRYS ER 20 MEQ PO TBCR
40.0000 meq | EXTENDED_RELEASE_TABLET | ORAL | Status: AC
  Administered 2016-04-28 (×2): 40 meq via ORAL
  Filled 2016-04-28 (×2): qty 2

## 2016-04-28 MED ORDER — LOPERAMIDE HCL 2 MG PO CAPS
2.0000 mg | ORAL_CAPSULE | Freq: Four times a day (QID) | ORAL | Status: DC | PRN
  Administered 2016-04-28 (×2): 2 mg via ORAL
  Filled 2016-04-28 (×2): qty 1

## 2016-04-28 NOTE — Progress Notes (Signed)
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 For norvo virus ,conservative management. Shigella infection , up to date recommends 500 mg of azithromycin daily for 3 days. Patient already on azithromycin.

## 2016-04-28 NOTE — Progress Notes (Signed)
Dr. Luberta MutterKonidena called and notified of abnormal stool results.  Information acknowledged.

## 2016-04-28 NOTE — Progress Notes (Signed)
[facility] - [place] at [facility]   PATIENT NAME: Brian Perkins    MR#:  [id number]  DATE OF BIRTH:  November 11, 1965  SUBJECTIVE admitted  yesterday because of syncope, found to have pneumonia, hypokalemia/patient had sepsis with elevated lactic acid on admission. Today lactic acid,WBC normalizing but patient has a lot of diarrhea and he  Still feels dizzy.still feels dizzy.  CHIEF COMPLAINT:   Chief Complaint  Patient presents with  . Dizziness    REVIEW OF SYSTEMS:   ROS CONSTITUTIONAL: No fever,  Has fatigue EYES: No blurred or double vision.  EARS, NOSE, AND THROAT: No tinnitus or ear pain.  RESPIRATORY: No cough, shortness of breath, wheezing or hemoptysis.  CARDIOVASCULAR: No chest pain, orthopnea, edema.  GASTROINTESTINAL: No nausea, vomiting,has diarrhea ,multiple loose stool GENITOURINARY: No dysuria, hematuria.  ENDOCRINE: No polyuria, nocturia,  HEMATOLOGY: No anemia, easy bruising or bleeding SKIN: No rash or lesion. MUSCULOSKELETAL: No joint pain or arthritis.   NEUROLOGIC: No tingling, numbness, weakness.  PSYCHIATRY: No anxiety or depression.   DRUG ALLERGIES:  No Known Allergies  VITALS:  Blood pressure (!) 119/57, pulse 91, temperature 98.3 F (36.8 C), temperature source Oral, resp. rate 20, height 6' (1.829 m), weight 127 kg (280 lb), SpO2 98 %.  PHYSICAL EXAMINATION:  GENERAL:  51 y.o.-year-old patient lying in the bed with no acute distress.  EYES: Pupils equal, round, reactive to light and accommodation. No scleral icterus. Extraocular muscles intact.  HEENT: Head atraumatic, normocephalic. Oropharynx and nasopharynx clear.  NECK:  Supple, no jugular venous distention. No thyroid enlargement, no tenderness.  LUNGS: Normal breath sounds bilaterally, no wheezing, rales,rhonchi or crepitation. No use of accessory muscles of respiration.  CARDIOVASCULAR: S1, S2 normal. No murmurs, rubs, or gallops.  ABDOMEN: Soft, nontender,  nondistended. Bowel sounds present. No organomegaly or mass.  EXTREMITIES: No pedal edema, cyanosis, or clubbing.  NEUROLOGIC: Cranial nerves II through XII are intact. Muscle strength 5/5 in all extremities. Sensation intact. Gait not checked.  PSYCHIATRIC: The patient is alert and oriented x 3.  SKIN: No obvious rash, lesion, or ulcer.    LABORATORY PANEL:   CBC  Recent Labs Lab 04/28/16 0351  WBC 10.5  HGB 14.4  HCT 40.4  PLT 215   ------------------------------------------------------------------------------------------------------------------  Chemistries   Recent Labs Lab 04/27/16 1701 04/28/16 0351  NA  --  135  K  --  3.0*  CL  --  105  CO2  --  23  GLUCOSE  --  119*  BUN  --  12  CREATININE  --  1.09  CALCIUM  --  7.9*  MG 1.5*  --    ------------------------------------------------------------------------------------------------------------------  Cardiac Enzymes  Recent Labs Lab 04/27/16 1005  TROPONINI <0.03   ------------------------------------------------------------------------------------------------------------------  RADIOLOGY:  Dg Chest 2 View  Result Date: 04/27/2016 CLINICAL DATA:  Sudden onset syncope. EXAM: CHEST  2 VIEW COMPARISON:  Chest MRA dated 11/10/2014. FINDINGS: Mildly enlarged cardiac silhouette. Linear density at the left lung base. 2 adjacent calcified granulomata in the left mid lung zone. Clear right lung. Mildly tortuous aorta. Thoracic spine degenerative changes. IMPRESSION: 1. Mild cardiomegaly. 2. Mild linear atelectasis or scarring the left lung base. Electronically Signed   By: Beckie Salts M.D.   On: 04/27/2016 12:54    EKG:   Orders placed or performed during the hospital encounter of 04/27/16  . ED EKG  . ED EKG    ASSESSMENT AND PLAN:   Sepsis with pneumonia; continue IV Rocephin, Zithromax,  IV fluids' blood cultures  are neg ,WBC negative. #2. hypokalemia /hypomagnesemia secondary to sepsis:/diuretics;  We'll replace the potassium #3. diarrhea likely viral gastroenteritis, check GI panel PCR,stool for cdiff. 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    All the records are reviewed and case discussed with Care Management/Social Workerr. Management plans discussed with the patient, family and they are in agreement.  CODE STATUS: full  TOTAL TIME TAKING CARE OF THIS PATIENT: 35 minutes.   POSSIBLE D/C IN 1-2 DAYS, DEPENDING ON CLINICAL CONDITION.   Katha HammingKONIDENA,Corrigan Kretschmer M.D on 04/28/2016 at 12:00 PM  Between 7am to 6pm - Pager - [phone]  After 6pm go to [email or web address] - password [email or web address]  [facility] [place] [facility]  Office  [phone]  CC: Primary care physician; Abbe AmsterdamOPLAND,JESSICA, MD   Note: This dictation was prepared with Dragon dictation along with smaller phrase technology. Any transcriptional errors that result from this process are unintentional.

## 2016-04-29 LAB — BASIC METABOLIC PANEL
ANION GAP: 5 (ref 5–15)
BUN: 6 mg/dL (ref 6–20)
CHLORIDE: 109 mmol/L (ref 101–111)
CO2: 25 mmol/L (ref 22–32)
Calcium: 8 mg/dL — ABNORMAL LOW (ref 8.9–10.3)
Creatinine, Ser: 1 mg/dL (ref 0.61–1.24)
GFR calc Af Amer: >60 mL/min (ref >60)
GLUCOSE: 101 mg/dL — AB (ref 65–99)
POTASSIUM: 3.5 mmol/L (ref 3.5–5.1)
Sodium: 139 mmol/L (ref 135–145)

## 2016-04-29 MED ORDER — AZITHROMYCIN 500 MG PO TABS: 500.0000 mg | ORAL_TABLET | Freq: Every day | ORAL | 0 refills | Status: DC

## 2016-04-29 MED ORDER — POTASSIUM CHLORIDE CRYS ER 20 MEQ PO TBCR
40.0000 meq | EXTENDED_RELEASE_TABLET | ORAL | Status: DC
  Administered 2016-04-29: 40 meq via ORAL
  Filled 2016-04-29: qty 2

## 2016-04-29 NOTE — Progress Notes (Signed)
[facility]         [place], [place]   04/29/2016  Patient: Brian Perkins   Date of Birth:  1965/09/19  Date of admission:  04/27/2016  Date of Discharge  04/29/2016    To Whom it May Concern:   Brian Perkins  may return to work on without restrictions.  PHYSICAL ACTIVITY:  FULL  If you have any questions or concerns, please don't hesitate to call.  Sincerely,   Milagros LollSudini, Tangelia Sanson R M.D Office : [phone]   .

## 2016-04-29 NOTE — Discharge Instructions (Signed)
Resume diet and activity as before ° ° °

## 2016-04-29 NOTE — Progress Notes (Signed)
DISCHARGE NOTE:  Pt given discharge instructions and prescriptions. Pt verbalized understanding.  

## 2016-04-30 NOTE — Discharge Summary (Signed)
[facility] [place] [facility]   PATIENT NAME: Brian Perkins    MR#:  [id number]  DATE OF BIRTH:  08/06/1965  DATE OF ADMISSION:  04/27/2016 ADMITTING PHYSICIAN: Shaune PollackQing Chen, MD  DATE OF DISCHARGE: 04/29/2016  2:48 PM  PRIMARY CARE PHYSICIAN: COPLAND,JESSICA, MD   ADMISSION DIAGNOSIS:  Sepsis, due to unspecified organism (HCC) [A41.9]  DISCHARGE DIAGNOSIS:  Active Problems:   Sepsis (HCC)   SECONDARY DIAGNOSIS:   Past Medical History:  Diagnosis Date  . Allergy   . Hypertension   . Hypogonadism male      ADMITTING HISTORY  HISTORY OF PRESENT ILLNESS:  Brian Roanrevor Chandler  is a 51 y.o. male with a known history of Hypertension and hypogonadism. 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 She started to feel lightheaded, nausea and almost passed out. He denies any cough, sputum or shortness of breath. He was found to have a fever 100.7, tachycardia at 140s and leukocytosis at 17,000. Chest x-ray show left sided infiltrate or atelectasis. Influenza is negative.   HOSPITAL COURSE:   * Diarrhea with dehydration and hypokalemia due to Shigella and Norovirus. Sepsis present on admission Patient treated symptomatically with IV hydration and replacing potassium through IV and oral. With this patient's dizziness resolved. He felt back to normal. Diarrhea is resolved. C. difficile negative. Started on azithromycin for Shigella. No abdominal tenderness on exam.  Initially there was concern regarding patient having pneumonia. On reviewing chest x-ray and this was scarring. He did not have any cough or shortness of breath.  CONSULTS OBTAINED:    DRUG ALLERGIES:  No Known Allergies  DISCHARGE MEDICATIONS:   Discharge Medication List as of 04/29/2016 12:30 PM    CONTINUE these medications which have CHANGED   Details  azithromycin (ZITHROMAX) 500 MG tablet Take 1 tablet (500 mg total) by mouth daily., Starting Tue 04/29/2016, Normal      CONTINUE these  medications which have NOT CHANGED   Details  amLODipine (NORVASC) 10 MG tablet TAKE 1 TABLET (10 MG TOTAL) BY MOUTH DAILY., Normal    aspirin 81 MG tablet Take 81 mg by mouth daily., Historical Med    benzonatate (TESSALON) 100 MG capsule Take 1-2 capsules (100-200 mg total) by mouth 3 (three) times daily as needed for cough., Starting Thu 04/26/2015, Normal    Fish Oil OIL by Does not apply route daily., Historical Med    fluticasone (FLONASE) 50 MCG/ACT nasal spray Place 2 sprays into the nose daily., Starting 09/03/2011, Until Thu 09/02/12, Normal    Glucosamine HCl-MSM (GLUCOSAMINE-MSM PO) Take 1,000 mg by mouth daily., Historical Med    hydrochlorothiazide (HYDRODIURIL) 25 MG tablet TAKE 1 TABLET (25 MG TOTAL) BY MOUTH DAILY., Normal    lisinopril (PRINIVIL,ZESTRIL) 40 MG tablet TAKE 1 TABLET (40 MG TOTAL) BY MOUTH DAILY., Normal    omeprazole (PRILOSEC OTC) 20 MG tablet Take 20 mg by mouth daily., Historical Med    testosterone (ANDROGEL) 50 MG/5GM (1%) GEL Place 10 g onto the skin daily., Starting Wed 08/30/2014, Phone In    Triamcinolone Acetonide (NASACORT AQ NA) Place into the nose., Historical Med    zolpidem (AMBIEN) 10 MG tablet Take 1 tablet (10 mg total) by mouth at bedtime. Use as needed for insomnia, Starting Wed 08/22/2015, Phone In        Today   VITAL SIGNS:  Blood pressure 130/90, pulse 94, temperature 98.7 F (37.1 C), resp. rate 16, height 6' (1.829 m), weight 127  kg (280 lb), SpO2 98 %.  I/O:  No intake or output data in the 24 hours ending 04/30/16 1652  PHYSICAL EXAMINATION:  Physical Exam  GENERAL:  51 y.o.-year-old patient lying in the bed with no acute distress.  LUNGS: Normal breath sounds bilaterally, no wheezing, rales,rhonchi or crepitation. No use of accessory muscles of respiration.  CARDIOVASCULAR: S1, S2 normal. No murmurs, rubs, or gallops.  ABDOMEN: Soft, non-tender, non-distended. Bowel sounds present. No organomegaly or mass.   NEUROLOGIC: Moves all 4 extremities. PSYCHIATRIC: The patient is alert and oriented x 3.  SKIN: No obvious rash, lesion, or ulcer.   DATA REVIEW:   CBC  Recent Labs Lab 04/28/16 0351  WBC 10.5  HGB 14.4  HCT 40.4  PLT 215    Chemistries   Recent Labs Lab 04/27/16 1701  04/29/16 0501  NA  --   < > 139  K  --   < > 3.5  CL  --   < > 109  CO2  --   < > 25  GLUCOSE  --   < > 101*  BUN  --   < > 6  CREATININE  --   < > 1.00  CALCIUM  --   < > 8.0*  MG 1.5*  --   --   < > = values in this interval not displayed.  Cardiac Enzymes  Recent Labs Lab 04/27/16 1005  TROPONINI <0.03    Microbiology Results  Results for orders placed or performed during the hospital encounter of 04/27/16  Culture, blood (routine x 2)     Status: None (Preliminary result)   Collection Time: 04/27/16 12:32 PM  Result Value Ref Range Status   Specimen Description BLOOD RIGHT HAND  Final   Special Requests   Final    BOTTLES DRAWN AEROBIC AND ANAEROBIC AER 6CC ANA 10CC   Culture NO GROWTH 3 DAYS  Final   Report Status PENDING  Incomplete  Culture, blood (routine x 2)     Status: None (Preliminary result)   Collection Time: 04/27/16 12:32 PM  Result Value Ref Range Status   Specimen Description BLOOD LEFT ARM  Final   Special Requests   Final    BOTTLES DRAWN AEROBIC AND ANAEROBIC AER 10CC ANA9CC   Culture NO GROWTH 3 DAYS  Final   Report Status PENDING  Incomplete  Gastrointestinal Panel by PCR , Stool     Status: Abnormal   Collection Time: 04/27/16  8:55 PM  Result Value Ref Range Status   Campylobacter species NOT DETECTED NOT DETECTED Final   Plesimonas shigelloides NOT DETECTED NOT DETECTED Final   Salmonella species NOT DETECTED NOT DETECTED Final   Yersinia enterocolitica NOT DETECTED NOT DETECTED Final   Vibrio species NOT DETECTED NOT DETECTED Final   Vibrio cholerae NOT DETECTED NOT DETECTED Final   Enteroaggregative E coli (EAEC) NOT DETECTED NOT DETECTED Final    Enteropathogenic E coli (EPEC) NOT DETECTED NOT DETECTED Final   Enterotoxigenic E coli (ETEC) NOT DETECTED NOT DETECTED Final   Shiga like toxin producing E coli (STEC) NOT DETECTED NOT DETECTED Final   Shigella/Enteroinvasive E coli (EIEC) DETECTED (A) NOT DETECTED Final    Comment: RESULT CALLED TO, READ BACK BY AND VERIFIED WITH: RACHEL VERDI ON 04/28/16 AT 1339 BY [facility]    Cryptosporidium NOT DETECTED NOT DETECTED Final   Cyclospora cayetanensis NOT DETECTED NOT DETECTED Final   Entamoeba histolytica NOT DETECTED NOT DETECTED Final   Giardia lamblia NOT DETECTED  NOT DETECTED Final   Adenovirus F40/41 NOT DETECTED NOT DETECTED Final   Astrovirus NOT DETECTED NOT DETECTED Final   Norovirus GI/GII DETECTED (A) NOT DETECTED Final    Comment: RESULT CALLED TO, READ BACK BY AND VERIFIED WITH: RACHEL VERDI ON 04/28/16 AT 1339 BY [facility]    Rotavirus A NOT DETECTED NOT DETECTED Final   Sapovirus (I, II, IV, and V) NOT DETECTED NOT DETECTED Final  C difficile quick scan w PCR reflex     Status: None   Collection Time: 04/27/16  8:55 PM  Result Value Ref Range Status   C Diff antigen NEGATIVE NEGATIVE Final   C Diff toxin NEGATIVE NEGATIVE Final   C Diff interpretation No C. difficile detected.  Final    RADIOLOGY:  No results found.  Follow up with PCP in 1 week.  Management plans discussed with the patient, family and they are in agreement.  CODE STATUS:  Code Status History    Date Active Date Inactive Code Status Order ID Comments User Context   04/27/2016  4:45 PM 04/29/2016  5:53 PM Full Code [id number]  Shaune Pollack, MD Inpatient      TOTAL TIME TAKING CARE OF THIS PATIENT ON DAY OF DISCHARGE: more than 30 minutes.   Milagros Loll R M.D on 04/30/2016 at 4:52 PM  Between 7am to 6pm - Pager - [phone]  After 6pm go to [email or web address] - password [email or web address] [facility]  [facility]  Office  [phone]  CC: Primary care physician; Abbe Amsterdam, MD  Note: This dictation  was prepared with Dragon dictation along with smaller phrase technology. Any transcriptional errors that result from this process are unintentional.

## 2016-05-02 LAB — CULTURE, BLOOD (ROUTINE X 2)
CULTURE: NO GROWTH
CULTURE: NO GROWTH

## 2016-05-05 ENCOUNTER — Inpatient Hospital Stay: Payer: BLUE CROSS/BLUE SHIELD | Admitting: Family Medicine

## 2016-05-12 ENCOUNTER — Encounter: Payer: Self-pay | Admitting: Family Medicine

## 2016-05-12 ENCOUNTER — Ambulatory Visit (HOSPITAL_BASED_OUTPATIENT_CLINIC_OR_DEPARTMENT_OTHER)
Admission: RE | Admit: 2016-05-12 | Discharge: 2016-05-12 | Disposition: A | Discharge status: Home / Self Care | Payer: BLUE CROSS/BLUE SHIELD | Source: Ambulatory Visit | Attending: Family Medicine | Admitting: Family Medicine

## 2016-05-12 ENCOUNTER — Ambulatory Visit (INDEPENDENT_AMBULATORY_CARE_PROVIDER_SITE_OTHER): Payer: BLUE CROSS/BLUE SHIELD | Admitting: Family Medicine

## 2016-05-12 VITALS — BP 151/98 | HR 91 | Temp 98.7°F | Ht 71.25 in | Wt 263.2 lb

## 2016-05-12 DIAGNOSIS — R059 Cough, unspecified: Secondary | ICD-10-CM

## 2016-05-12 DIAGNOSIS — R5381 Other malaise: Secondary | ICD-10-CM | POA: Insufficient documentation

## 2016-05-12 DIAGNOSIS — R05 Cough: Secondary | ICD-10-CM | POA: Diagnosis not present

## 2016-05-12 DIAGNOSIS — R52 Pain, unspecified: Secondary | ICD-10-CM | POA: Insufficient documentation

## 2016-05-12 DIAGNOSIS — J101 Influenza due to other identified influenza virus with other respiratory manifestations: Secondary | ICD-10-CM

## 2016-05-12 DIAGNOSIS — G4709 Other insomnia: Secondary | ICD-10-CM | POA: Diagnosis not present

## 2016-05-12 LAB — POCT INFLUENZA A/B
Influenza A, POC: NEGATIVE
Influenza B, POC: POSITIVE — AB

## 2016-05-12 MED ORDER — ZOLPIDEM TARTRATE 10 MG PO TABS: 10.0000 mg | ORAL_TABLET | Freq: Every day | ORAL | 1 refills | Status: DC

## 2016-05-12 MED ORDER — HYDROCODONE-HOMATROPINE 5-1.5 MG/5ML PO SYRP: 5.0000 mL | ORAL_SOLUTION | Freq: Three times a day (TID) | ORAL | 0 refills | Status: DC | PRN

## 2016-05-12 MED ORDER — OSELTAMIVIR PHOSPHATE 75 MG PO CAPS: 75.0000 mg | ORAL_CAPSULE | Freq: Two times a day (BID) | ORAL | 0 refills | Status: DC

## 2016-05-12 NOTE — Progress Notes (Signed)
[facility] [address] [place], [place] [address] [phone]  Date:  05/12/2016   Name:  Brian Perkins   DOB:  Apr 26, 1965   MRN:  [id number]  PCP:  Abbe Amsterdam, MD    Chief Complaint: Hospitalization Follow-up (Pt was admitted to the hospital on 1/14. Pt states that sx's were better when he first left the hospital but has gone down hill since. Only given 3 days of  Azithromyxin and feels maybe he wasn't given enough. )   History of Present Illness:  Brian Perkins is a 50 y.o. very pleasant male patient who presents with the following: Last seen by myself in 2016- he has a history of HTN, hypogonadism.   Here today for a hospital follow-up and also acute illness.  Earlier this month he was working Office manager at [employer] when he started to feel very sick- collapsed in the ER. Was admitted to [facility] 04-27-16 to 04-29-16.  Patient treated for sepsis, diarrhea with dehydration and hypokalemia due to Shigella and Norovirus.  At discharge, started on azithromycin (ZITHROMAX) 500 MG tablet.  Brian Perkins states that he was feeling better when he was released to home and seemed to be getting back to his normal state of helth. However over the last 2-3 days he has become ill again- today is Monday and he first noted sx Friday evening.  He has noted a cough, feels ill, feels tired and weak, he has coughed up some mucus. He did have diarrhea 3 days ago- not as severe as it was when he was inpt however.  This is now resolved.    No vomiting and he is still able to eat and drink They are not sure if he has had a fever- he has been on tylenol.   He had an anitpyretic this am a few hours ago He has a ST, headache, fatigue, body aches He has had his flu shot.  However he is often exposed to flu in his work and was also recently admitted to the hospital The cough is keeping him awake He continues to use [place] as needed for shift work insomnia.   BP  Readings from Last 3 Encounters:  05/12/16 (!) 151/98  04/29/16 130/90  08/28/14 121/75    Patient Active Problem List   Diagnosis Date Noted  . Sepsis (HCC) 04/27/2016  . Obesity 08/28/2014  . Hypogonadism male 07/21/2013  . Essential hypertension, benign 07/11/2011    Past Medical History:  Diagnosis Date  . Allergy   . Hypertension   . Hypogonadism male     Past Surgical History:  Procedure Laterality Date  . CHOLECYSTECTOMY      Social History  Substance Use Topics  . Smoking status: Former Smoker    Types: Cigarettes, Cigars    Quit date: 04/14/1989  . Smokeless tobacco: Former Neurosurgeon     Comment: not much of smoker  . Alcohol use Yes     Comment: rarely 1 or 2 beers    Family History  Problem Relation Age of Onset  . Diabetes Mother   . Hyperlipidemia Father   . Hypertension Father     No Known Allergies  Medication list has been reviewed and updated.  Current Outpatient Prescriptions on File Prior to Visit  Medication Sig Dispense Refill  . amLODipine (NORVASC) 10 MG tablet TAKE 1 TABLET (10 MG TOTAL) BY MOUTH DAILY. 30 tablet 0  . aspirin 81 MG tablet  Take 81 mg by mouth daily.    . Fish Oil OIL by Does not apply route daily.    . Glucosamine HCl-MSM (GLUCOSAMINE-MSM PO) Take 1,000 mg by mouth daily.    . hydrochlorothiazide (HYDRODIURIL) 25 MG tablet TAKE 1 TABLET (25 MG TOTAL) BY MOUTH DAILY. 30 tablet 0  . lisinopril (PRINIVIL,ZESTRIL) 40 MG tablet TAKE 1 TABLET (40 MG TOTAL) BY MOUTH DAILY. 30 tablet 0  . Triamcinolone Acetonide (NASACORT AQ NA) Place into the nose.    . zolpidem (AMBIEN) 10 MG tablet Take 1 tablet (10 mg total) by mouth at bedtime. Use as needed for insomnia 30 tablet 1  . fluticasone (FLONASE) 50 MCG/ACT nasal spray Place 2 sprays into the nose daily. 16 g 6   No current facility-administered medications on file prior to visit.     Review of Systems:  .sper   Physical Examination: Vitals:   05/12/16 1159  BP: (!) 151/98   Pulse: 91  Temp: 98.7 F (37.1 C)   Vitals:   05/12/16 1159  Weight: 263 lb 3.2 oz (119.4 kg)  Height: 5' 11.25" (1.81 m)   Body mass index is 36.45 kg/m. Ideal Body Weight: Weight in (lb) to have BMI = 25: 180.1  GEN: WDWN, NAD, Non-toxic, A & O x 3, does not appear to feel great but is not toxic HEENT: Atraumatic, Normocephalic. Neck supple. No masses, No LAD.  Bilateral TM wnl, oropharynx normal.  PEERL,EOMI.   Ears and Nose: No external deformity. CV: RRR, No M/G/R. No JVD. No thrill. No extra heart sounds. PULM: CTA B, no wheezes, crackles, rhonchi. No retractions. No resp. distress. No accessory muscle use. ABD: S, NT, ND. No rebound. No HSM. EXTR: No c/c/e NEURO Normal gait.  PSYCH: Normally interactive. Conversant. Not depressed or anxious appearing.  Calm demeanor.   Dg Chest 2 View  Result Date: 05/12/2016 CLINICAL DATA:  Cough and chest congestion. EXAM: CHEST  2 VIEW COMPARISON:  04/27/2016 FINDINGS: The heart size and mediastinal contours are within normal limits. Both lungs are clear. The visualized skeletal structures are unremarkable. IMPRESSION: No active cardiopulmonary disease. Electronically Signed   By: Francene Boyers M.D.   On: 05/12/2016 13:02   Dg Chest 2 View  Result Date: 04/27/2016 CLINICAL DATA:  Sudden onset syncope. EXAM: CHEST  2 VIEW COMPARISON:  Chest MRA dated 11/10/2014. FINDINGS: Mildly enlarged cardiac silhouette. Linear density at the left lung base. 2 adjacent calcified granulomata in the left mid lung zone. Clear right lung. Mildly tortuous aorta. Thoracic spine degenerative changes. IMPRESSION: 1. Mild cardiomegaly. 2. Mild linear atelectasis or scarring the left lung base. Electronically Signed   By: Beckie Salts M.D.   On: 04/27/2016 12:54   Results for orders placed or performed in visit on 05/12/16  POCT Influenza A/B  Result Value Ref Range   Influenza A, POC Negative Negative   Influenza B, POC Positive (A) Negative    Assessment  and Plan: Body aches - Plan: POCT Influenza A/B, DG Chest 2 View  Malaise - Plan: POCT Influenza A/B, DG Chest 2 View  Cough - Plan: DG Chest 2 View, HYDROcodone-homatropine (HYCODAN) 5-1.5 MG/5ML syrup  Other insomnia - Plan: zolpidem (AMBIEN) 10 MG tablet, DISCONTINUED: zolpidem (AMBIEN) 10 MG tablet  Influenza B - Plan: oseltamivir (TAMIFLU) 75 MG capsule  Here today to follow- up from a recent hospital stay with sepsis and infectious diarrhea.  He was discharged to home about 2 weeks ago and was getting better.  However he now has the flu His CXR is normal Given his recent illness will treat with tamiflu Hycodan as needed for cough Note for work Counseled pt and his wife that if Beryle Beamsrevor is not doing ok- is not able to eat and drink, seems to be getting worse- that he needs to go to the ER and they state understanding Refilled his Remus Lofflerambien but asked him not to use while he is sick and not to combine with hycodan  Meds ordered this encounter  Medications  . ibuprofen (ADVIL,MOTRIN) 200 MG tablet    Sig: Take 200 mg by mouth every 6 (six) hours as needed.  Marland Kitchen. acetaminophen (TYLENOL) 325 MG tablet    Sig: Take 650 mg by mouth every 6 (six) hours as needed.  Marland Kitchen. Dextromethorphan Polistirex (DELSYM PO)    Sig: Take by mouth as needed.  Marland Kitchen. Dextromethorphan-Guaifenesin (MUCINEX DM PO)    Sig: Take by mouth as needed.  Marland Kitchen. oseltamivir (TAMIFLU) 75 MG capsule    Sig: Take 1 capsule (75 mg total) by mouth 2 (two) times daily.    Dispense:  10 capsule    Refill:  0  . DISCONTD: zolpidem (AMBIEN) 10 MG tablet    Sig: Take 1 tablet (10 mg total) by mouth at bedtime. Use as needed for insomnia    Dispense:  30 tablet    Refill:  1    Not to exceed 4 additional fills before 08/20/2015  . HYDROcodone-homatropine (HYCODAN) 5-1.5 MG/5ML syrup    Sig: Take 5 mLs by mouth every 8 (eight) hours as needed for cough.    Dispense:  90 mL    Refill:  0  . zolpidem (AMBIEN) 10 MG tablet    Sig: Take 1  tablet (10 mg total) by mouth at bedtime. Use as needed for insomnia    Dispense:  30 tablet    Refill:  1    Not to exceed 4 additional fills before 08/20/2015     Signed Abbe AmsterdamJessica Copland, MD

## 2016-05-12 NOTE — Patient Instructions (Addendum)
It was good to see you today- you do have the flu!  Rest and drink plenty of fluids.  Use OTC medications as needed for your symptoms, and we are going to start you on tamiflu take it twice a day for 5 days  Use the hycodan as needed for cough- remember this will make you sleepy.  Do NOT combine with ambien  If you are NOT doing ok please go to the ER

## 2016-05-17 ENCOUNTER — Other Ambulatory Visit: Payer: Self-pay | Admitting: Family Medicine

## 2016-05-17 DIAGNOSIS — I10 Essential (primary) hypertension: Secondary | ICD-10-CM

## 2016-05-19 ENCOUNTER — Telehealth: Payer: Self-pay | Admitting: Family Medicine

## 2016-05-19 DIAGNOSIS — R05 Cough: Secondary | ICD-10-CM

## 2016-05-19 DIAGNOSIS — R059 Cough, unspecified: Secondary | ICD-10-CM

## 2016-05-20 ENCOUNTER — Encounter: Payer: Self-pay | Admitting: Family Medicine

## 2016-05-20 DIAGNOSIS — R059 Cough, unspecified: Secondary | ICD-10-CM

## 2016-05-20 DIAGNOSIS — R05 Cough: Secondary | ICD-10-CM

## 2016-05-20 NOTE — Telephone Encounter (Signed)
Pt is requesting refill on Hycodan. Please advise.

## 2016-05-21 MED ORDER — HYDROCODONE-HOMATROPINE 5-1.5 MG/5ML PO SYRP: 5.0000 mL | ORAL_SOLUTION | Freq: Three times a day (TID) | ORAL | 0 refills | Status: DC | PRN

## 2016-06-20 ENCOUNTER — Other Ambulatory Visit: Payer: Self-pay | Admitting: Family Medicine

## 2016-06-20 DIAGNOSIS — I10 Essential (primary) hypertension: Secondary | ICD-10-CM

## 2016-07-23 ENCOUNTER — Other Ambulatory Visit: Payer: Self-pay | Admitting: Family Medicine

## 2016-07-23 DIAGNOSIS — I10 Essential (primary) hypertension: Secondary | ICD-10-CM

## 2016-07-23 DIAGNOSIS — G4709 Other insomnia: Secondary | ICD-10-CM

## 2016-10-29 ENCOUNTER — Encounter: Payer: Self-pay | Admitting: Family Medicine

## 2017-01-16 ENCOUNTER — Other Ambulatory Visit: Payer: Self-pay | Admitting: Family Medicine

## 2017-01-16 DIAGNOSIS — G4709 Other insomnia: Secondary | ICD-10-CM

## 2017-01-16 NOTE — Telephone Encounter (Signed)
Requesting: zolpidem (AMBIEN) 10 MG tablet Contract UDS Last OV: 05/12/2016 Last Refill: 07/23/2016  Please Advise

## 2017-01-16 NOTE — Telephone Encounter (Signed)
Reviewed NCCSR- nothing of concern 

## 2017-04-19 ENCOUNTER — Other Ambulatory Visit: Payer: Self-pay | Admitting: Family Medicine

## 2017-04-19 DIAGNOSIS — I10 Essential (primary) hypertension: Secondary | ICD-10-CM

## 2017-07-19 ENCOUNTER — Other Ambulatory Visit: Payer: Self-pay | Admitting: Family Medicine

## 2017-07-19 DIAGNOSIS — G4709 Other insomnia: Secondary | ICD-10-CM

## 2017-07-20 ENCOUNTER — Encounter: Payer: Self-pay | Admitting: Family Medicine

## 2017-07-20 NOTE — Telephone Encounter (Signed)
Received refill request for zolpidem (AMBIEN) 10 MG tablet . Last office visit 05/12/16 and last refill 01/16/2017.

## 2017-12-11 ENCOUNTER — Other Ambulatory Visit: Payer: Self-pay | Admitting: Family Medicine

## 2017-12-11 DIAGNOSIS — I10 Essential (primary) hypertension: Secondary | ICD-10-CM

## 2017-12-15 ENCOUNTER — Other Ambulatory Visit: Payer: Self-pay | Admitting: Family Medicine

## 2017-12-15 DIAGNOSIS — I10 Essential (primary) hypertension: Secondary | ICD-10-CM

## 2018-01-09 ENCOUNTER — Other Ambulatory Visit: Payer: Self-pay | Admitting: Family Medicine

## 2018-01-09 DIAGNOSIS — I10 Essential (primary) hypertension: Secondary | ICD-10-CM

## 2018-01-11 ENCOUNTER — Other Ambulatory Visit: Payer: Self-pay | Admitting: Family Medicine

## 2018-01-11 DIAGNOSIS — I10 Essential (primary) hypertension: Secondary | ICD-10-CM

## 2018-01-12 NOTE — Telephone Encounter (Signed)
Author phoned pt. To notify of need to make OV per Dr. Patsy Lager prior to additional refills. Author left detailed VM asking for call back to schedule. PEC OK to schedule.

## 2018-01-22 ENCOUNTER — Other Ambulatory Visit: Payer: Self-pay | Admitting: Family Medicine

## 2018-01-22 DIAGNOSIS — I10 Essential (primary) hypertension: Secondary | ICD-10-CM

## 2018-02-10 ENCOUNTER — Other Ambulatory Visit: Payer: Self-pay | Admitting: Family Medicine

## 2018-02-10 DIAGNOSIS — I10 Essential (primary) hypertension: Secondary | ICD-10-CM

## 2018-02-26 ENCOUNTER — Other Ambulatory Visit: Payer: Self-pay | Admitting: Family Medicine

## 2018-02-26 DIAGNOSIS — I10 Essential (primary) hypertension: Secondary | ICD-10-CM

## 2018-03-14 ENCOUNTER — Other Ambulatory Visit: Payer: Self-pay | Admitting: Family Medicine

## 2018-03-14 DIAGNOSIS — I10 Essential (primary) hypertension: Secondary | ICD-10-CM

## 2018-03-27 ENCOUNTER — Other Ambulatory Visit: Payer: Self-pay | Admitting: Family Medicine

## 2018-03-27 DIAGNOSIS — I10 Essential (primary) hypertension: Secondary | ICD-10-CM

## 2018-04-18 ENCOUNTER — Other Ambulatory Visit: Payer: Self-pay | Admitting: Family Medicine

## 2018-04-18 DIAGNOSIS — I10 Essential (primary) hypertension: Secondary | ICD-10-CM

## 2018-06-24 ENCOUNTER — Other Ambulatory Visit: Payer: Self-pay | Admitting: Family Medicine

## 2018-06-24 DIAGNOSIS — I10 Essential (primary) hypertension: Secondary | ICD-10-CM

## 2018-06-27 NOTE — Progress Notes (Addendum)
[facility] [address], [address] [place], [place] [address] [phone]  Date:  06/30/2018   Name:  Brian Perkins   DOB:  1965/06/27   MRN:  [id number]  PCP:  Pearline Cables, MD    Chief Complaint: Medication Refill   History of Present Illness:  Brian Perkins is a 53 y.o. very pleasant male patient who presents with the following:  Here today for medication review and follow-up visit Have not seen him since January 2018 for hospital follow-up. 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  Wonders if he has ear wax   Can do complete labs today if he would like-  Colon cancer screening: no family history, will order cologuard for him  Tetanus vaccine: we think 2013 Flu shot is UTD  Home BP generally ok, but he is out of his meds- all 3, for a couple of weeks   He has noted some allergies, some cough However no fever, no travel, no known COVID exposure  He has used hycodan in the past and would like a RF if ok   Patient Active Problem List   Diagnosis Date Noted  . Sepsis (HCC) 04/27/2016  . Obesity 08/28/2014  . Hypogonadism male 07/21/2013  . Essential hypertension, benign 07/11/2011    Past Medical History:  Diagnosis Date  . Allergy   . Hypertension   . Hypogonadism male     Past Surgical History:  Procedure Laterality Date  . CHOLECYSTECTOMY      Social History   Tobacco Use  . Smoking status: Former Smoker    Types: Cigarettes, Cigars    Last attempt to quit: 04/14/1989    Years since quitting: 29.2  . Smokeless tobacco: Former Neurosurgeon  .  Tobacco comment: not much of smoker  Substance Use Topics  . Alcohol use: Yes    Comment: rarely 1 or 2 beers  . Drug use: No    Family History  Problem Relation Age of Onset  . Diabetes Mother   . Hyperlipidemia Father   . Hypertension Father     No Known Allergies  Medication list has been reviewed and updated.  Current Outpatient Medications on File Prior to Visit  Medication Sig Dispense Refill  . amLODipine (NORVASC) 10 MG tablet TAKE 1 TABLET BY MOUTH EVERY DAY, OVERDUE FOR OFFICE VISIT 7 tablet 0  . hydrochlorothiazide (HYDRODIURIL) 25 MG tablet TAKE 1 TABLET BY MOUTH EVERY DAY - OVERDUE FOR OFFICE VISIT 7 tablet 0  . lisinopril (PRINIVIL,ZESTRIL) 40 MG tablet TAKE 1 TABLET BY MOUTH DAILY. LAST SEEN IN 04/2016.NO FURTHER REFILLS WITHOUT AN OFFICE VISIT. 5 tablet 0  . zolpidem (AMBIEN) 10 MG tablet TAKE 1 TABLET AT BEDTIME USE AS NEEDED FOR INSOMNIA 30 tablet 3  . acetaminophen (TYLENOL) 325 MG tablet Take 650 mg by mouth every 6 (six) hours as needed.    Marland Kitchen aspirin 81 MG tablet Take 81 mg by mouth daily.    Marland Kitchen Dextromethorphan Polistirex (DELSYM PO) Take by mouth as needed.    Marland Kitchen Dextromethorphan-Guaifenesin [facility]  DM PO) Take by mouth as needed.    . Fish Oil OIL by Does not apply route daily.    . fluticasone (FLONASE) 50 MCG/ACT nasal spray Place 2 sprays into the nose daily. 16 g 6  . Glucosamine HCl-MSM (GLUCOSAMINE-MSM PO) Take 1,000 mg by mouth daily.    Marland Kitchen HYDROcodone-homatropine (HYCODAN) 5-1.5 MG/5ML syrup Take 5 mLs by mouth every 8 (eight) hours as needed for cough. 75 mL 0  . ibuprofen (ADVIL,MOTRIN) 200 MG tablet Take 200 mg by mouth every 6 (six) hours as needed.    Marland Kitchen oseltamivir (TAMIFLU) 75 MG capsule Take 1 capsule (75 mg total) by mouth 2 (two) times daily. 10 capsule 0  . Triamcinolone Acetonide (NASACORT AQ NA) Place into the nose.     No current facility-administered medications on file prior to visit.     Review of Systems:  As per HPI- otherwise  negative.  No fever or chills   Physical Examination: Vitals:   06/30/18 1046  BP: (!) 148/94  Pulse: 72  Resp: 16  Temp: 98.3 F (36.8 C)  SpO2: 96%   Vitals:   06/30/18 1046  Weight: 272 lb (123.4 kg)  Height: 5' 11.5" (1.816 m)   Body mass index is 37.41 kg/m. Ideal Body Weight: Weight in (lb) to have BMI = 25: 181.4  GEN: WDWN, NAD, Non-toxic, A & O x 3, obese, looks well  HEENT: Atraumatic, Normocephalic. Neck supple. No masses, No LAD.  Bilateral TM wnl, oropharynx normal.  PEERL,EOMI.    Small amount of ear wax in left ear, removed.  However this was not occluding his ear canal at all  Ears and Nose: No external deformity. CV: RRR, No M/G/R. No JVD. No thrill. No extra heart sounds. PULM: CTA B, no wheezes, crackles, rhonchi. No retractions. No resp. distress. No accessory muscle use. ABD: S, NT, ND, +BS. No rebound. No HSM. EXTR: No c/c/e NEURO Normal gait.  PSYCH: Normally interactive. Conversant. Not depressed or anxious appearing.  Calm demeanor.    Assessment and Plan: Essential hypertension, benign - Plan: CBC, Comprehensive metabolic panel  Screening for colon cancer  Screening for diabetes mellitus - Plan: Comprehensive metabolic panel, Hemoglobin A1c  Screening for prostate cancer - Plan: PSA  Screening for hyperlipidemia - Plan: Lipid panel  Screening for HIV (human immunodeficiency virus) - Plan: HIV Antibody (routine testing w rflx)  Accelerated hypertension - Plan: amLODipine (NORVASC) 10 MG tablet, hydrochlorothiazide (HYDRODIURIL) 25 MG tablet, lisinopril (PRINIVIL,ZESTRIL) 40 MG tablet  Other insomnia - Plan: zolpidem (AMBIEN) 10 MG tablet  Cough - Plan: HYDROcodone-homatropine (HYCODAN) 5-1.5 MG/5ML syrup  Visit today after prolonged absence.  Labs pain as above, I will be in touch with his reports. He has been out of his blood pressure medicine for a few weeks.  Refilled today, asked him to monitor his pressure at home and let me know if  not within desired range once he is back on his medicines. Refilled Ambien for shiftwork sleep disorder Give a small bottle of Hycodan to use as needed Reminded not to use these 2 medications together, do not use if he needs to drive.  Meds ordered this encounter  Medications  . amLODipine (NORVASC) 10 MG tablet    Sig: Take 1 tablet (10 mg total) by mouth daily.    Dispense:  90 tablet    Refill:  3  . hydrochlorothiazide (HYDRODIURIL) 25 MG tablet    Sig: TAKE 1 TABLET BY MOUTH EVERY DAY  Dispense:  90 tablet    Refill:  3  . lisinopril (PRINIVIL,ZESTRIL) 40 MG tablet    Sig: TAKE 1 TABLET BY MOUTH DAILY.    Dispense:  90 tablet    Refill:  3  . zolpidem (AMBIEN) 10 MG tablet    Sig: TAKE 1 TABLET AT BEDTIME USE AS NEEDED FOR INSOMNIA    Dispense:  30 tablet    Refill:  3  . HYDROcodone-homatropine (HYCODAN) 5-1.5 MG/5ML syrup    Sig: Take 5 mLs by mouth every 8 (eight) hours as needed for cough.    Dispense:  60 mL    Refill:  0     Signed Abbe Amsterdam, MD   Received his labs Results for orders placed or performed in visit on 06/30/18  CBC  Result Value Ref Range   WBC 9.2 4.0 - 10.5 K/uL   RBC 5.22 4.22 - 5.81 Mil/uL   Platelets 246.0 150.0 - 400.0 K/uL   Hemoglobin 15.1 13.0 - 17.0 g/dL   HCT 67.6 19.5 - 09.3 %   MCV 82.2 78.0 - 100.0 fl   MCHC 35.1 30.0 - 36.0 g/dL   RDW 26.7 12.4 - 58.0 %  Comprehensive metabolic panel  Result Value Ref Range   Sodium 141 135 - 145 mEq/L   Potassium 3.7 3.5 - 5.1 mEq/L   Chloride 105 96 - 112 mEq/L   CO2 29 19 - 32 mEq/L   Glucose, Bld 102 (H) 70 - 99 mg/dL   BUN 12 6 - 23 mg/dL   Creatinine, Ser 9.98 0.40 - 1.50 mg/dL   Total Bilirubin 0.7 0.2 - 1.2 mg/dL   Alkaline Phosphatase 74 39 - 117 U/L   AST 20 0 - 37 U/L   ALT 27 0 - 53 U/L   Total Protein 6.8 6.0 - 8.3 g/dL   Albumin 3.9 3.5 - 5.2 g/dL   Calcium 9.1 8.4 - 33.8 mg/dL   GFR 25.05 >39.76 mL/min  Hemoglobin A1c  Result Value Ref Range   Hgb A1c MFr  Bld 5.7 4.6 - 6.5 %  Lipid panel  Result Value Ref Range   Cholesterol 183 0 - 200 mg/dL   Triglycerides 734.1 (H) 0.0 - 149.0 mg/dL   HDL 93.79 >02.40 mg/dL   VLDL 97.3 0.0 - 53.2 mg/dL   LDL Cholesterol 992 (H) 0 - 99 mg/dL   Total CHOL/HDL Ratio 4    NonHDL 135.49   PSA  Result Value Ref Range   PSA 1.07 0.10 - 4.00 ng/mL   Message to pt   Lab Results  Component Value Date   PSA 1.07 06/30/2018   PSA 0.72 08/28/2014   PSA 0.97 07/21/2013    Blood counts are normal Metabolic profile looks fine A1c- average blood sugar over the previous 3 months- is just barely in the pre-diabetes range.  We will monitor this.  Weight loss and exercise is your best defense against developing diabetes later on.  Cholesterol is reasonable, but your 10 year estimated risk of cardiovascular disease is still over 10% with these numbers- see below:   The 10-year ASCVD risk score Denman George DC Jr., et al., 2013) is: 12.9%   Values used to calculate the score:     Age: 3 years     Sex: Male     Is Non-Hispanic African American: Yes     Diabetic: No     Tobacco smoker: No     Systolic Blood Pressure: 148 mmHg  Is BP treated: Yes     HDL Cholesterol: 47.7 mg/dL     Total Cholesterol: 183 mg/dL  I would suggest that we add a cholesterol med to your regimen to reduce your risk.  If you are ok with this, just let me know and I will send in an rx for you  PSA is ok, up a bit from 2016 but stable from 2015.  Please be SURE to have your PSA repeated in one year to watch for any upward trend.   Lab Results      Component                Value               Date                      PSA                      1.07                06/30/2018                PSA                      0.72                08/28/2014                PSA                      0.97                07/21/2013             Stay safe, please see me in 6 months for a BP check  Let me know about the cholesterol med

## 2018-06-30 ENCOUNTER — Encounter: Payer: Self-pay | Admitting: Family Medicine

## 2018-06-30 ENCOUNTER — Ambulatory Visit (INDEPENDENT_AMBULATORY_CARE_PROVIDER_SITE_OTHER): Payer: 59 | Admitting: Family Medicine

## 2018-06-30 ENCOUNTER — Other Ambulatory Visit: Payer: Self-pay

## 2018-06-30 VITALS — BP 148/94 | HR 72 | Temp 98.3°F | Resp 16 | Ht 71.5 in | Wt 272.0 lb

## 2018-06-30 DIAGNOSIS — Z114 Encounter for screening for human immunodeficiency virus [HIV]: Secondary | ICD-10-CM

## 2018-06-30 DIAGNOSIS — R059 Cough, unspecified: Secondary | ICD-10-CM

## 2018-06-30 DIAGNOSIS — Z13220 Encounter for screening for lipoid disorders: Secondary | ICD-10-CM | POA: Diagnosis not present

## 2018-06-30 DIAGNOSIS — Z1211 Encounter for screening for malignant neoplasm of colon: Secondary | ICD-10-CM

## 2018-06-30 DIAGNOSIS — G4709 Other insomnia: Secondary | ICD-10-CM

## 2018-06-30 DIAGNOSIS — R05 Cough: Secondary | ICD-10-CM

## 2018-06-30 DIAGNOSIS — Z131 Encounter for screening for diabetes mellitus: Secondary | ICD-10-CM

## 2018-06-30 DIAGNOSIS — Z125 Encounter for screening for malignant neoplasm of prostate: Secondary | ICD-10-CM

## 2018-06-30 DIAGNOSIS — I10 Essential (primary) hypertension: Secondary | ICD-10-CM | POA: Diagnosis not present

## 2018-06-30 LAB — CBC
HCT: 42.9 % (ref 39.0–52.0)
Hemoglobin: 15.1 g/dL (ref 13.0–17.0)
MCHC: 35.1 g/dL (ref 30.0–36.0)
MCV: 82.2 fl (ref 78.0–100.0)
Platelets: 246 10*3/uL (ref 150.0–400.0)
RBC: 5.22 Mil/uL (ref 4.22–5.81)
RDW: 13.8 % (ref 11.5–15.5)
WBC: 9.2 10*3/uL (ref 4.0–10.5)

## 2018-06-30 LAB — COMPREHENSIVE METABOLIC PANEL
ALT: 27 U/L (ref 0–53)
AST: 20 U/L (ref 0–37)
Albumin: 3.9 g/dL (ref 3.5–5.2)
Alkaline Phosphatase: 74 U/L (ref 39–117)
BUN: 12 mg/dL (ref 6–23)
CALCIUM: 9.1 mg/dL (ref 8.4–10.5)
CO2: 29 meq/L (ref 19–32)
Chloride: 105 mEq/L (ref 96–112)
Creatinine, Ser: 1.1 mg/dL (ref 0.40–1.50)
GFR: 84.68 mL/min (ref >60.00)
Glucose, Bld: 102 mg/dL — ABNORMAL HIGH (ref 70–99)
Potassium: 3.7 mEq/L (ref 3.5–5.1)
Sodium: 141 mEq/L (ref 135–145)
Total Bilirubin: 0.7 mg/dL (ref 0.2–1.2)
Total Protein: 6.8 g/dL (ref 6.0–8.3)

## 2018-06-30 LAB — LIPID PANEL
CHOLESTEROL: 183 mg/dL (ref 0–200)
HDL: 47.7 mg/dL (ref >39.00)
LDL Cholesterol: 104 mg/dL — ABNORMAL HIGH (ref 0–99)
NonHDL: 135.49
Total CHOL/HDL Ratio: 4
Triglycerides: 157 mg/dL — ABNORMAL HIGH (ref 0.0–149.0)
VLDL: 31.4 mg/dL (ref 0.0–40.0)

## 2018-06-30 LAB — HEMOGLOBIN A1C: Hgb A1c MFr Bld: 5.7 % (ref 4.6–6.5)

## 2018-06-30 LAB — PSA: PSA: 1.07 ng/mL (ref 0.10–4.00)

## 2018-06-30 MED ORDER — ZOLPIDEM TARTRATE 10 MG PO TABS: ORAL_TABLET | ORAL | 3 refills | Status: DC

## 2018-06-30 MED ORDER — AMLODIPINE BESYLATE 10 MG PO TABS: 10.0000 mg | ORAL_TABLET | Freq: Every day | ORAL | 3 refills | Status: DC

## 2018-06-30 MED ORDER — HYDROCODONE-HOMATROPINE 5-1.5 MG/5ML PO SYRP: 5.0000 mL | ORAL_SOLUTION | Freq: Three times a day (TID) | ORAL | 0 refills | Status: DC | PRN

## 2018-06-30 MED ORDER — HYDROCHLOROTHIAZIDE 25 MG PO TABS: ORAL_TABLET | ORAL | 3 refills | Status: DC

## 2018-06-30 MED ORDER — LISINOPRIL 40 MG PO TABS: ORAL_TABLET | ORAL | 3 refills | Status: DC

## 2018-06-30 NOTE — Patient Instructions (Addendum)
We will set you up with a Cologuard kit to screen for colon cancer at home  Just complete the kit and send it back in I will be in touch with your labs ASAP I refilled your Ambien, continue to use this no more than a few times a week.  Do not drive after using this medication, do not combine with any other sedating substance I also refilled your cough medication.  Do not combine this with Ambien, also do not drive after taking the cough syrup  Your blood pressure is high today, but you have been out of your medication.  Please continue to monitor your blood pressure at home.  Once you are back in your medications, I would like your blood pressure to be 120- 135/70-90.  Let me know if your blood pressure is not inside these parameters

## 2018-07-01 ENCOUNTER — Encounter: Payer: Self-pay | Admitting: Family Medicine

## 2018-07-01 LAB — HIV ANTIBODY (ROUTINE TESTING W REFLEX): HIV 1&2 Ab, 4th Generation: NONREACTIVE

## 2018-08-30 ENCOUNTER — Encounter: Payer: 59 | Admitting: Family Medicine

## 2018-09-16 ENCOUNTER — Encounter: Payer: Self-pay | Admitting: *Deleted

## 2018-09-16 ENCOUNTER — Emergency Department
Admission: EM | Admit: 2018-09-16 | Discharge: 2018-09-16 | Disposition: A | Discharge status: Home / Self Care | Payer: Worker's Compensation | Attending: Emergency Medicine | Admitting: Emergency Medicine

## 2018-09-16 ENCOUNTER — Other Ambulatory Visit: Payer: Self-pay

## 2018-09-16 ENCOUNTER — Emergency Department: Payer: Worker's Compensation

## 2018-09-16 DIAGNOSIS — Z7982 Long term (current) use of aspirin: Secondary | ICD-10-CM | POA: Insufficient documentation

## 2018-09-16 DIAGNOSIS — I10 Essential (primary) hypertension: Secondary | ICD-10-CM | POA: Insufficient documentation

## 2018-09-16 DIAGNOSIS — Z87891 Personal history of nicotine dependence: Secondary | ICD-10-CM | POA: Diagnosis not present

## 2018-09-16 DIAGNOSIS — M79641 Pain in right hand: Secondary | ICD-10-CM | POA: Diagnosis present

## 2018-09-16 DIAGNOSIS — Z79899 Other long term (current) drug therapy: Secondary | ICD-10-CM | POA: Diagnosis not present

## 2018-09-16 MED ORDER — MELOXICAM 7.5 MG PO TABS
15.0000 mg | ORAL_TABLET | Freq: Every day | ORAL | Status: DC
  Administered 2018-09-16: 15 mg via ORAL

## 2018-09-16 MED ORDER — MELOXICAM 15 MG PO TABS: 15.0000 mg | ORAL_TABLET | Freq: Every day | ORAL | 1 refills | Status: AC

## 2018-09-16 MED ORDER — TRAMADOL HCL 50 MG PO TABS: 50.0000 mg | ORAL_TABLET | Freq: Four times a day (QID) | ORAL | 0 refills | Status: AC | PRN

## 2018-09-16 NOTE — ED Triage Notes (Addendum)
Pt has right hand pain.  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

## 2018-09-16 NOTE — ED Provider Notes (Signed)
[facility] Emergency Department Provider Note  ____________________________________________  Time seen: Approximately 11:48 PM  I have reviewed the triage vital signs and the nursing notes.   HISTORY  Chief Complaint Hand Injury    HPI Brian Perkins is a 53 y.o. 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  Patient is uncertain about specifics surrounding mechanism. Patient has noticed bruising and swelling along the dorsal aspect of the right hand.  He denies numbness and tingling.  He is right-hand dominant and has not sustained similar injuries in the past.  No other alleviating measures have been attempted.        Past Medical History:  Diagnosis Date  . Allergy   . Hypertension   . Hypogonadism male     Patient Active Problem List   Diagnosis Date Noted  . Sepsis (HCC) 04/27/2016  . Obesity 08/28/2014  . Hypogonadism male 07/21/2013  . Essential hypertension, benign 07/11/2011    Past Surgical History:  Procedure Laterality Date  . CHOLECYSTECTOMY      Prior to Admission medications   Medication Sig Start Date End Date Taking? Authorizing Provider  acetaminophen (TYLENOL) 325 MG tablet Take 650 mg by mouth every 6 (six) hours as needed.    [provider]  amLODipine (NORVASC) 10 MG tablet Take 1 tablet (10 mg total) by mouth daily. 06/30/18   Copland, Gwenlyn FoundJessica C, MD  aspirin 81 MG tablet Take 81 mg by mouth daily.    [provider]  Fish Oil OIL by Does not apply route daily.    [provider]  fluticasone (FLONASE) 50 MCG/ACT nasal spray Place 2 sprays into the nose daily. 09/03/11 09/02/12  Anders SimmondsMcClung, Angela M, PA-C  Glucosamine HCl-MSM (GLUCOSAMINE-MSM PO) Take 1,000 mg by mouth daily.    [provider]  hydrochlorothiazide (HYDRODIURIL) 25 MG tablet TAKE 1 TABLET BY MOUTH  EVERY DAY 06/30/18   Copland, Gwenlyn FoundJessica C, MD  HYDROcodone-homatropine (HYCODAN) 5-1.5 MG/5ML syrup Take 5 mLs by mouth every 8 (eight) hours as needed for cough. 06/30/18   Copland, Gwenlyn FoundJessica C, MD  lisinopril (PRINIVIL,ZESTRIL) 40 MG tablet TAKE 1 TABLET BY MOUTH DAILY. 06/30/18   Copland, Gwenlyn FoundJessica C, MD  meloxicam (MOBIC) 15 MG tablet Take 1 tablet (15 mg total) by mouth daily for 7 days. 09/16/18 09/23/18  Orvil FeilWoods, Tanja Gift M, PA-C  traMADol (ULTRAM) 50 MG tablet Take 1 tablet (50 mg total) by mouth every 6 (six) hours as needed for up to 3 days. 09/16/18 09/19/18  Orvil FeilWoods, Markie Heffernan M, PA-C  Triamcinolone Acetonide (NASACORT AQ NA) Place into the nose.    [provider]  zolpidem (AMBIEN) 10 MG tablet TAKE 1 TABLET AT BEDTIME USE AS NEEDED FOR INSOMNIA 06/30/18   Copland, Gwenlyn FoundJessica C, MD    Allergies Patient has no known allergies.  Family History  Problem Relation Age of Onset  . Diabetes Mother   . Hyperlipidemia Father   . Hypertension Father     Social History Social History   Tobacco Use  . Smoking status: Former Smoker    Types: Cigarettes, Cigars    Last attempt to quit: 04/14/1989    Years since quitting: 29.4  . Smokeless tobacco: Former NeurosurgeonUser  . Tobacco comment: not much of smoker  Substance Use Topics  . Alcohol use: Yes    Comment: rarely 1 or 2 beers  . Drug use: No  Review of Systems  Constitutional: No fever/chills Eyes: No visual changes. No discharge ENT: No upper respiratory complaints. Cardiovascular: no chest pain. Respiratory: no cough. No SOB. Gastrointestinal: No abdominal pain.  No nausea, no vomiting.  No diarrhea.  No constipation. Musculoskeletal: Patient has right hand pain.  Skin: Negative for rash, abrasions, lacerations, ecchymosis. Neurological: Negative for headaches, focal weakness or numbness.  ____________________________________________   PHYSICAL EXAM:  VITAL SIGNS: ED Triage Vitals  Enc Vitals Group     BP 09/16/18 2207 (!) 149/78      Pulse Rate 09/16/18 2207 (!) 122     Resp 09/16/18 2207 20     Temp 09/16/18 2207 99 F (37.2 C)     Temp Source 09/16/18 2207 Oral     SpO2 09/16/18 2207 98 %     Weight 09/16/18 2205 260 lb (117.9 kg)     Height 09/16/18 2205 6' (1.829 m)     Head Circumference --      Peak Flow --      Pain Score 09/16/18 2205 7     Pain Loc --      Pain Edu? --      Excl. in GC? --      Constitutional: Alert and oriented. Well appearing and in no acute distress. Eyes: Conjunctivae are normal. PERRL. EOMI. Head: Atraumatic. Cardiovascular: Normal rate, regular rhythm. Normal S1 and S2.  Good peripheral circulation. Respiratory: Normal respiratory effort without tachypnea or retractions. Lungs CTAB. Good air entry to the bases with no decreased or absent breath sounds. Musculoskeletal: Patient is able to move all 5 right fingers.  He has no deficits with flexor and extensor tendon testing of all 5 right digits.  He is able to perform pronation and supination of the right forearm.  No pain with palpation of the right anatomical snuffbox.  Palpable radial pulse, right. Neurologic:  Normal speech and language. No gross focal neurologic deficits are appreciated.  Skin:  Skin is warm, dry and intact. No rash noted. Psychiatric: Mood and affect are normal. Speech and behavior are normal. Patient exhibits appropriate insight and judgement.   ____________________________________________   LABS (all labs ordered are listed, but only abnormal results are displayed)  Labs Reviewed - No data to display ____________________________________________  EKG   ____________________________________________  RADIOLOGY I personally viewed and evaluated these images as part of my medical decision making, as well as reviewing the written report by the radiologist.  Dg Hand Complete Right  Result Date: 09/16/2018 CLINICAL DATA:  Right hand pain. EXAM: RIGHT HAND - COMPLETE 3+ VIEW COMPARISON:  None. FINDINGS:  There is no evidence of fracture or dislocation. There is no evidence of arthropathy or other focal bone abnormality. Soft tissues are unremarkable. IMPRESSION: Negative. Electronically Signed   By: Deatra Robinson M.D.   On: 09/16/2018 22:50    ____________________________________________    PROCEDURES  Procedure(s) performed:    Procedures    Medications  meloxicam (MOBIC) tablet 15 mg (15 mg Oral Given 09/16/18 2343)     ____________________________________________   INITIAL IMPRESSION / ASSESSMENT AND PLAN / ED COURSE  Pertinent labs & imaging results that were available during my care of the patient were reviewed by me and considered in my medical decision making (see chart for details).  Review of the [place] CSRS was performed in accordance of the [facility] prior to dispensing any controlled drugs.          Assessment and plan Right hand pain Patient presents to  the emergency department with acute right hand pain that occurred during an injury tonight at work.  On physical exam, patient has swelling and ecchymosis along the dorsal aspect of the right hand.  Differential diagnosis included flexor or extensor tendon injury, hand contusion or fracture.  X-ray examination of the right hand revealed no bony abnormality.  Patient was placed in a Velcro wrist splint.  He was given meloxicam in the emergency department.  He was discharged with a short course of meloxicam and tramadol.  He was advised to follow-up with hand specialist Dr. Stephenie Acres.  All patient questions were answered.     ____________________________________________  FINAL CLINICAL IMPRESSION(S) / ED DIAGNOSES  Final diagnoses:  Pain of right hand      NEW MEDICATIONS STARTED DURING THIS VISIT:  ED Discharge Orders         Ordered    traMADol (ULTRAM) 50 MG tablet  Every 6 hours PRN     09/16/18 2330    meloxicam (MOBIC) 15 MG tablet  Daily     09/16/18 2330              This chart was dictated  using voice recognition software/Dragon. Despite best efforts to proofread, errors can occur which can change the meaning. Any change was purely unintentional.    Orvil Feil, PA-C 09/16/18 2352    Phineas Semen, MD 09/17/18 [phone]

## 2018-10-14 NOTE — Care Management (Signed)
Post discharge note entry 10/14/18 at 1130AM: [facility] [address] Lorina Rabon sent over fax requesting refill for Tramadol HCL.  This RNCM has faxed to orthopedic provider Alexia Hernandex-[facility] for review as patient was supposed to follow up with ortho.

## 2018-11-08 ENCOUNTER — Other Ambulatory Visit: Payer: Self-pay

## 2018-11-08 ENCOUNTER — Emergency Department
Admission: EM | Admit: 2018-11-08 | Discharge: 2018-11-08 | Disposition: A | Discharge status: Home / Self Care | Payer: 59 | Attending: Emergency Medicine | Admitting: Emergency Medicine

## 2018-11-08 ENCOUNTER — Encounter: Payer: Self-pay | Admitting: Emergency Medicine

## 2018-11-08 DIAGNOSIS — Z79899 Other long term (current) drug therapy: Secondary | ICD-10-CM | POA: Diagnosis not present

## 2018-11-08 DIAGNOSIS — Y990 Civilian activity done for income or pay: Secondary | ICD-10-CM | POA: Insufficient documentation

## 2018-11-08 DIAGNOSIS — Z7982 Long term (current) use of aspirin: Secondary | ICD-10-CM | POA: Diagnosis not present

## 2018-11-08 DIAGNOSIS — S59911A Unspecified injury of right forearm, initial encounter: Secondary | ICD-10-CM | POA: Diagnosis not present

## 2018-11-08 DIAGNOSIS — I10 Essential (primary) hypertension: Secondary | ICD-10-CM | POA: Insufficient documentation

## 2018-11-08 DIAGNOSIS — W503XXA Accidental bite by another person, initial encounter: Secondary | ICD-10-CM

## 2018-11-08 DIAGNOSIS — Y939 Activity, unspecified: Secondary | ICD-10-CM | POA: Insufficient documentation

## 2018-11-08 DIAGNOSIS — Z87891 Personal history of nicotine dependence: Secondary | ICD-10-CM | POA: Insufficient documentation

## 2018-11-08 DIAGNOSIS — Y929 Unspecified place or not applicable: Secondary | ICD-10-CM | POA: Insufficient documentation

## 2018-11-08 MED ORDER — BACITRACIN ZINC 500 UNIT/GM EX OINT
TOPICAL_OINTMENT | Freq: Two times a day (BID) | CUTANEOUS | Status: DC
  Administered 2018-11-08: 05:00:00 via TOPICAL

## 2018-11-08 MED ORDER — AMOXICILLIN-POT CLAVULANATE 875-125 MG PO TABS
1.0000 | ORAL_TABLET | Freq: Once | ORAL | Status: AC
  Administered 2018-11-08: 1 via ORAL
  Filled 2018-11-08: qty 1

## 2018-11-08 MED ORDER — AMOXICILLIN-POT CLAVULANATE 875-125 MG PO TABS: 1.0000 | ORAL_TABLET | Freq: Two times a day (BID) | ORAL | 0 refills | Status: AC
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
## 2018-11-10 ENCOUNTER — Other Ambulatory Visit: Payer: Self-pay | Admitting: Family Medicine

## 2018-11-10 DIAGNOSIS — G4709 Other insomnia: Secondary | ICD-10-CM

## 2019-03-02 ENCOUNTER — Other Ambulatory Visit: Payer: Self-pay | Admitting: Family Medicine

## 2019-03-02 ENCOUNTER — Encounter: Payer: Self-pay | Admitting: Family Medicine

## 2019-03-02 DIAGNOSIS — G4709 Other insomnia: Secondary | ICD-10-CM

## 2019-03-02 NOTE — Telephone Encounter (Signed)
Requesting:Ambien Contract:none, needs csc WUX:LKGM, needs uds Last Visit:06/30/2018 Next Visit:none scheduled with pcp Last Refill:11/10/2018  Please Advise

## 2019-03-25 ENCOUNTER — Other Ambulatory Visit: Payer: Self-pay

## 2019-03-28 ENCOUNTER — Encounter: Payer: 59 | Admitting: Family Medicine

## 2019-03-29 ENCOUNTER — Other Ambulatory Visit: Payer: Self-pay | Admitting: Family Medicine

## 2019-03-29 DIAGNOSIS — G4709 Other insomnia: Secondary | ICD-10-CM

## 2019-03-30 ENCOUNTER — Encounter: Payer: 59 | Admitting: Family Medicine

## 2019-03-30 NOTE — Telephone Encounter (Signed)
Called left detailed message per PCP instructions to schedule appointment

## 2019-04-01 ENCOUNTER — Other Ambulatory Visit: Payer: Self-pay

## 2019-04-02 NOTE — Progress Notes (Addendum)
[facility] [address] [place], [place] [address] [phone]  Date:  04/04/2019   Name:  Brian Perkins   DOB:  1965-05-25   MRN:  [id number]  PCP:  Darreld Mclean, MD    Chief Complaint: Hypertension   History of Present Illness:  Brian Perkins is a 53 y.o. 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  He does have cologuard kit but it is in the trunk of his car- it has been there for months.  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  However, per my recollection only 1 good ear is necessary for DOT but I don't have these details aviailable  He will be working rotating shifts at his new job He uses ambien most night for insomnia/ shift work sleep disorder He did not take his BP meds yet today which is likely why his BP is elevated  Amlodipine 10 lisiniopril 40 mg  Hydrochlorothiazide 25   03/29/2019  1   03/29/2019  Zolpidem Tartrate 10 MG Tablet  30.00  30 Je Cop   [id number]   [place] (0290)   0  0.50 LME  Comm Ins   [place]  03/02/2019  1   03/02/2019  Zolpidem Tartrate 10 MG Tablet  30.00  30 Je Cop   [id number]   [place] (0290)   0  0.50 LME  Comm Ins   [place]   02/03/2019  1   11/10/2018  Zolpidem Tartrate 10 MG Tablet  30.00  30 Je Cop   [id number]   [place] (0290)   3  0.50 LME  Comm Ins   [place]  01/05/2019  1   11/10/2018  Zolpidem Tartrate 10 MG Tablet  30.00  30 Je Cop   [id number]   [place] (0290)   2  0.50 LME  Comm Ins   [place]  12/08/2018  1   11/10/2018  Zolpidem Tartrate 10 MG Tablet  30.00  30 Je Cop   [id number]   [place] (0290)   1  0.50 LME  Comm Ins   [place]  11/11/2018  1   11/10/2018  Zolpidem Tartrate 10 MG Tablet  30.00  30 Je Cop   [id number]   [place] (0290)   0  0.50 LME  Comm Ins   [place]  10/13/2018  1   06/30/2018  Zolpidem Tartrate 10 MG Tablet  30.00  30 Je Cop   [id number]   [place] (0290)   3  0.50 LME  Comm Ins   [place]  09/17/2018  1   09/16/2018  Tramadol Hcl 50 MG Tablet  12.00  3 [place]   [id number]   [place] (0290)   0  20.00 MME  Comm Ins   [place]  08/23/2018  1   06/30/2018  Zolpidem Tartrate 10 MG Tablet  30.00  30 Je Cop   [id number]   [place] (0290)   2  0.50 LME     He feels well, no fever or chills  Patient Active Problem List   Diagnosis Date Noted  . Sepsis ([place]) 04/27/2016  . Obesity 08/28/2014  . Hypogonadism male 07/21/2013  . Essential hypertension, benign 07/11/2011    Past Medical History:  Diagnosis Date  . Allergy   . Hypertension   . Hypogonadism male     Past Surgical History:  Procedure Laterality Date  . CHOLECYSTECTOMY      Social History   Tobacco Use  . Smoking status: Former Smoker    Types: Cigarettes, Cigars    Quit date: 04/14/1989    Years since quitting: 29.9  . Smokeless tobacco: Former Systems developer  . Tobacco comment: not much of smoker  Substance Use Topics  . Alcohol use: Yes    Comment: rarely 1 or 2 beers  . Drug use: No    Family History  Problem Relation Age of Onset  . Diabetes Mother   . Hyperlipidemia Father   . Hypertension Father     No Known Allergies  Medication list has been reviewed and updated.  Current Outpatient Medications on File Prior to Visit  Medication Sig Dispense Refill  . acetaminophen (TYLENOL)  325 MG tablet Take 650 mg by mouth every 6 (six) hours as needed.    Marland Kitchen amLODipine (NORVASC) 10 MG tablet Take 1 tablet (10 mg total) by mouth daily. 90 tablet 3  . aspirin 81 MG tablet Take 81 mg by mouth daily.    . Fish Oil OIL by Does not apply route daily.    . Glucosamine HCl-MSM (GLUCOSAMINE-MSM PO) Take 1,000 mg by mouth daily.    . hydrochlorothiazide (HYDRODIURIL) 25 MG tablet TAKE 1 TABLET BY MOUTH EVERY DAY 90 tablet 3  . HYDROcodone-homatropine (HYCODAN) 5-1.5 MG/5ML syrup Take 5 mLs by mouth every 8 (eight) hours as needed for cough. 60 mL 0  . lisinopril (PRINIVIL,ZESTRIL) 40 MG tablet TAKE 1 TABLET BY MOUTH DAILY. 90 tablet 3  . Triamcinolone Acetonide (NASACORT AQ NA) Place into the nose.    . zolpidem (AMBIEN) 10 MG tablet TAKE 1 TABLET BY MOUTH AT BEDTIME USE AS NEEDED FOR INSOMNIA 30 tablet 0  . fluticasone (FLONASE) 50 MCG/ACT nasal spray Place 2 sprays into the nose daily. 16 g 6   No current facility-administered medications on file prior to visit.    Review of Systems:  As per HPI- otherwise negative.   Physical Examination: Vitals:   04/04/19 1317  BP: (!) 144/88  Pulse: 97  Resp: 18  Temp: 97.9 F (36.6 C)   Vitals:   04/04/19 1317  Weight: 280 lb (127 kg)  Height: 6' (1.829 m)   Body mass index is 37.97 kg/m. Ideal Body Weight: Weight in (lb) to have BMI = 25: 183.9   Patient is observed over video monitor today, he looks well, overweight.  No cough, wheezing, shortness of breath is noted  Assessment and Plan: Isolated proteinuria with morphologic lesion - Plan: Microalbumin / creatinine urine ratio  Essential hypertension, benign - Plan: CBC, Comprehensive metabolic panel  Screening for diabetes mellitus - Plan: Hemoglobin A1c  Screening for hyperlipidemia - Plan: Lipid panel  Other insomnia  Needs flu shot - Plan: Flu Vaccine QUAD 6+ mos PF IM (Fluarix Quad PF)  Here today for follow-up visit He recently went for a DOT exam was found  to have proteinuria, will recheck a microalbumin today Blood pressure is somewhat high, but he has not taken his medication yet today Encouraged him to reorder a new Cologuard kit Labs pending as above I will review and complete his paperwork for new job application I do not have audiology capacity at my office so cannot update his hearing exam  This visit occurred during the SARS-CoV-2 public health emergency.  Safety protocols were in place, including screening questions prior to the visit, additional usage of staff PPE, and extensive cleaning of exam room while observing appropriate contact time as indicated for disinfecting solutions.    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 and will turn in today   Results for orders placed or performed in visit on 04/04/19  Microalbumin / creatinine urine ratio  Result Value Ref Range   Microalb, Ur 1.6 0.0 - 1.9 mg/dL   Creatinine,U 353.9 mg/dL   Microalb Creat Ratio 0.5 0.0 - 30.0 mg/g  CBC  Result Value Ref Range   WBC 8.6 4.0 - 10.5 K/uL   RBC 5.44 4.22 - 5.81 Mil/uL   Platelets 274.0 150.0 - 400.0 K/uL   Hemoglobin 15.6 13.0 - 17.0 g/dL   HCT 44.8 39.0 - 52.0 %   MCV 82.5 78.0 - 100.0 fl   MCHC 34.7 30.0 - 36.0 g/dL   RDW 13.9 11.5 - 15.5 %  Comprehensive metabolic panel  Result Value Ref Range   Sodium 139 135 - 145 mEq/L   Potassium 3.1 (L) 3.5 - 5.1 mEq/L   Chloride 101 96 - 112 mEq/L   CO2 29 19 - 32 mEq/L   Glucose, Bld 90 70 - 99 mg/dL   BUN 8 6 - 23 mg/dL   Creatinine, Ser 0.95 0.40 - 1.50 mg/dL   Total Bilirubin 1.0 0.2 - 1.2 mg/dL   Alkaline Phosphatase 69 39 - 117 U/L   AST 21 0 - 37 U/L   ALT 29 0 - 53 U/L   Total Protein 7.1 6.0 - 8.3 g/dL   Albumin 4.0 3.5 - 5.2 g/dL   GFR 100.00 >60.00 mL/min   Calcium 9.4 8.4 -  10.5 mg/dL  Hemoglobin A1c  Result Value Ref Range   Hgb A1c MFr Bld 5.6 4.6 - 6.5 %  Lipid panel  Result Value Ref Range   Cholesterol 142 0 - 200 mg/dL   Triglycerides 143.0 0.0 - 149.0 mg/dL   HDL 42.20 >39.00 mg/dL   VLDL 28.6 0.0 - 40.0 mg/dL   LDL Cholesterol 71 0 - 99 mg/dL   Total CHOL/HDL Ratio 3    NonHDL 100.09    Message to pt as follows: Your urine protein test is normal- no sign of abnormal protein in the urine Kidney function is normal on your bloodwork as well  Blood counts are normal Metabolic profile normal except for low potassium, likely due to hydrochlorothiazide (diuretic) use.  I am going to send in a potassium supplement for you to take for just a few days.  Take for 5 days, then hold the rest in case needed later. Once you finish 5 days, please work towards increased dietary  potassium intake- you can find a good list of high potassium foods online.  A1c is normal Cholesterol is overall good! However, your cardiovascular risk over the next 10 years is still higher than I would like.  A cholesterol medication can help lower this risk if you are interested in adding this to your regimen?  The 10-year ASCVD risk score Mikey Bussing DC Brooke Bonito., et al., 2013) is: 11.9%   Values used to calculate the score:     Age: 35 years     Sex: Male     Is Non-Hispanic African American: Yes     Diabetic: No     Tobacco smoker: No     Systolic Blood Pressure: 343 mmHg     Is BP treated: Yes     HDL Cholesterol: 42.2 mg/dL     Total Cholesterol: 142 mg/dL  Take care, will get your paperwork in today Please see me in about 6 months

## 2019-04-04 ENCOUNTER — Other Ambulatory Visit: Payer: Self-pay

## 2019-04-04 ENCOUNTER — Encounter: Payer: Self-pay | Admitting: Family Medicine

## 2019-04-04 ENCOUNTER — Ambulatory Visit (INDEPENDENT_AMBULATORY_CARE_PROVIDER_SITE_OTHER): Payer: 59 | Admitting: Family Medicine

## 2019-04-04 VITALS — BP 144/88 | HR 97 | Temp 97.9°F | Resp 18 | Ht 72.0 in | Wt 280.0 lb

## 2019-04-04 DIAGNOSIS — E876 Hypokalemia: Secondary | ICD-10-CM

## 2019-04-04 DIAGNOSIS — N069 Isolated proteinuria with unspecified morphologic lesion: Secondary | ICD-10-CM | POA: Diagnosis not present

## 2019-04-04 DIAGNOSIS — Z131 Encounter for screening for diabetes mellitus: Secondary | ICD-10-CM

## 2019-04-04 DIAGNOSIS — I10 Essential (primary) hypertension: Secondary | ICD-10-CM | POA: Diagnosis not present

## 2019-04-04 DIAGNOSIS — Z23 Encounter for immunization: Secondary | ICD-10-CM | POA: Diagnosis not present

## 2019-04-04 DIAGNOSIS — Z13220 Encounter for screening for lipoid disorders: Secondary | ICD-10-CM

## 2019-04-04 DIAGNOSIS — G4709 Other insomnia: Secondary | ICD-10-CM

## 2019-04-04 LAB — COMPREHENSIVE METABOLIC PANEL
ALT: 29 U/L (ref 0–53)
AST: 21 U/L (ref 0–37)
Albumin: 4 g/dL (ref 3.5–5.2)
Alkaline Phosphatase: 69 U/L (ref 39–117)
BUN: 8 mg/dL (ref 6–23)
CO2: 29 mEq/L (ref 19–32)
Calcium: 9.4 mg/dL (ref 8.4–10.5)
Chloride: 101 mEq/L (ref 96–112)
Creatinine, Ser: 0.95 mg/dL (ref 0.40–1.50)
GFR: 100 mL/min (ref >60.00)
Glucose, Bld: 90 mg/dL (ref 70–99)
Potassium: 3.1 mEq/L — ABNORMAL LOW (ref 3.5–5.1)
Sodium: 139 mEq/L (ref 135–145)
Total Bilirubin: 1 mg/dL (ref 0.2–1.2)
Total Protein: 7.1 g/dL (ref 6.0–8.3)

## 2019-04-04 LAB — LIPID PANEL
Cholesterol: 142 mg/dL (ref 0–200)
HDL: 42.2 mg/dL (ref >39.00)
LDL Cholesterol: 71 mg/dL (ref 0–99)
NonHDL: 100.09
Total CHOL/HDL Ratio: 3
Triglycerides: 143 mg/dL (ref 0.0–149.0)
VLDL: 28.6 mg/dL (ref 0.0–40.0)

## 2019-04-04 LAB — CBC
HCT: 44.8 % (ref 39.0–52.0)
Hemoglobin: 15.6 g/dL (ref 13.0–17.0)
MCHC: 34.7 g/dL (ref 30.0–36.0)
MCV: 82.5 fl (ref 78.0–100.0)
Platelets: 274 10*3/uL (ref 150.0–400.0)
RBC: 5.44 Mil/uL (ref 4.22–5.81)
RDW: 13.9 % (ref 11.5–15.5)
WBC: 8.6 10*3/uL (ref 4.0–10.5)

## 2019-04-04 LAB — HEMOGLOBIN A1C: Hgb A1c MFr Bld: 5.6 % (ref 4.6–6.5)

## 2019-04-04 LAB — MICROALBUMIN / CREATININE URINE RATIO
Creatinine,U: 353.9 mg/dL
Microalb Creat Ratio: 0.5 mg/g (ref 0.0–30.0)
Microalb, Ur: 1.6 mg/dL (ref 0.0–1.9)

## 2019-04-04 NOTE — Patient Instructions (Signed)
Good to see you today!  I will be in touch with your labs asap and will take care of your forms asap!  [place]

## 2019-04-05 ENCOUNTER — Encounter: Payer: Self-pay | Admitting: Family Medicine

## 2019-04-05 DIAGNOSIS — E785 Hyperlipidemia, unspecified: Secondary | ICD-10-CM

## 2019-04-05 MED ORDER — POTASSIUM CHLORIDE CRYS ER 20 MEQ PO TBCR: 20.0000 meq | EXTENDED_RELEASE_TABLET | Freq: Every day | ORAL | 0 refills | Status: DC

## 2019-04-05 MED ORDER — ROSUVASTATIN CALCIUM 10 MG PO TABS: 10.0000 mg | ORAL_TABLET | Freq: Every day | ORAL | 3 refills | Status: DC

## 2019-04-05 NOTE — Addendum Note (Signed)
Addended by: Darreld Mclean on: 04/05/2019 06:56 AM   Modules accepted: Orders

## 2019-04-11 ENCOUNTER — Telehealth: Payer: Self-pay | Admitting: Family Medicine

## 2019-04-11 NOTE — Telephone Encounter (Signed)
Spoke with patient, refaxed questionnaire to company.

## 2019-04-11 NOTE — Telephone Encounter (Signed)
Patient is calling regarding forms that where left at office has a questionnaire on back that was not filled out. Patient is requesting if the forms could be completed and refax please?  The forms need to returned to [facility]. Please advise Cb- [phone]

## 2019-04-12 NOTE — Telephone Encounter (Signed)
Patient called and stated that the company has not received the faxed information.  Fax# [phone].  Please refax both sides.

## 2019-04-12 NOTE — Telephone Encounter (Signed)
[employer] refaxing now as I am at another office location.

## 2019-04-19 ENCOUNTER — Other Ambulatory Visit: Payer: Self-pay | Admitting: Family Medicine

## 2019-04-19 DIAGNOSIS — E876 Hypokalemia: Secondary | ICD-10-CM

## 2019-04-26 ENCOUNTER — Other Ambulatory Visit: Payer: Self-pay | Admitting: Family Medicine

## 2019-04-26 DIAGNOSIS — G4709 Other insomnia: Secondary | ICD-10-CM

## 2019-04-27 NOTE — Telephone Encounter (Signed)
Last OV 04/04/2019--No upcoming appts. No UDS/No CSC Last refill 03/29/2019  #30 no refills

## 2019-05-24 ENCOUNTER — Other Ambulatory Visit: Payer: Self-pay | Admitting: Family Medicine

## 2019-05-24 DIAGNOSIS — I10 Essential (primary) hypertension: Secondary | ICD-10-CM

## 2019-06-03 ENCOUNTER — Ambulatory Visit: Payer: Self-pay

## 2019-06-03 ENCOUNTER — Other Ambulatory Visit: Payer: Self-pay

## 2019-06-03 DIAGNOSIS — Z20822 Contact with and (suspected) exposure to covid-19: Secondary | ICD-10-CM

## 2019-06-03 LAB — POC COVID19 BINAXNOW: SARS Coronavirus 2 Ag: NEGATIVE

## 2019-06-03 NOTE — Addendum Note (Signed)
Addended by: Jethro Bolus on: 06/03/2019 11:57 AM   Modules accepted: Orders

## 2019-06-03 NOTE — Progress Notes (Signed)
Patient is a Copywriter, advertising for [employer].  Rapid Covid Antigen test is negative.

## 2019-06-06 NOTE — Progress Notes (Signed)
Here are the results from Friday.

## 2019-07-23 ENCOUNTER — Other Ambulatory Visit: Payer: Self-pay | Admitting: Family Medicine

## 2019-07-23 DIAGNOSIS — G4709 Other insomnia: Secondary | ICD-10-CM

## 2019-07-23 NOTE — Telephone Encounter (Signed)
Requesting:ambien Contract:n/a UDS: n/a Last Visit:12.21.20 Next Visit:n/a Last Refill:1.13.21  Please Advise

## 2019-10-17 IMAGING — DX RIGHT HAND - COMPLETE 3+ VIEW
3 series · 3 of 3 positions shown · non-contrast
Comparison: None.

CLINICAL DATA: Right hand pain.

EXAM:
RIGHT HAND - COMPLETE 3+ VIEW

[hand ap]
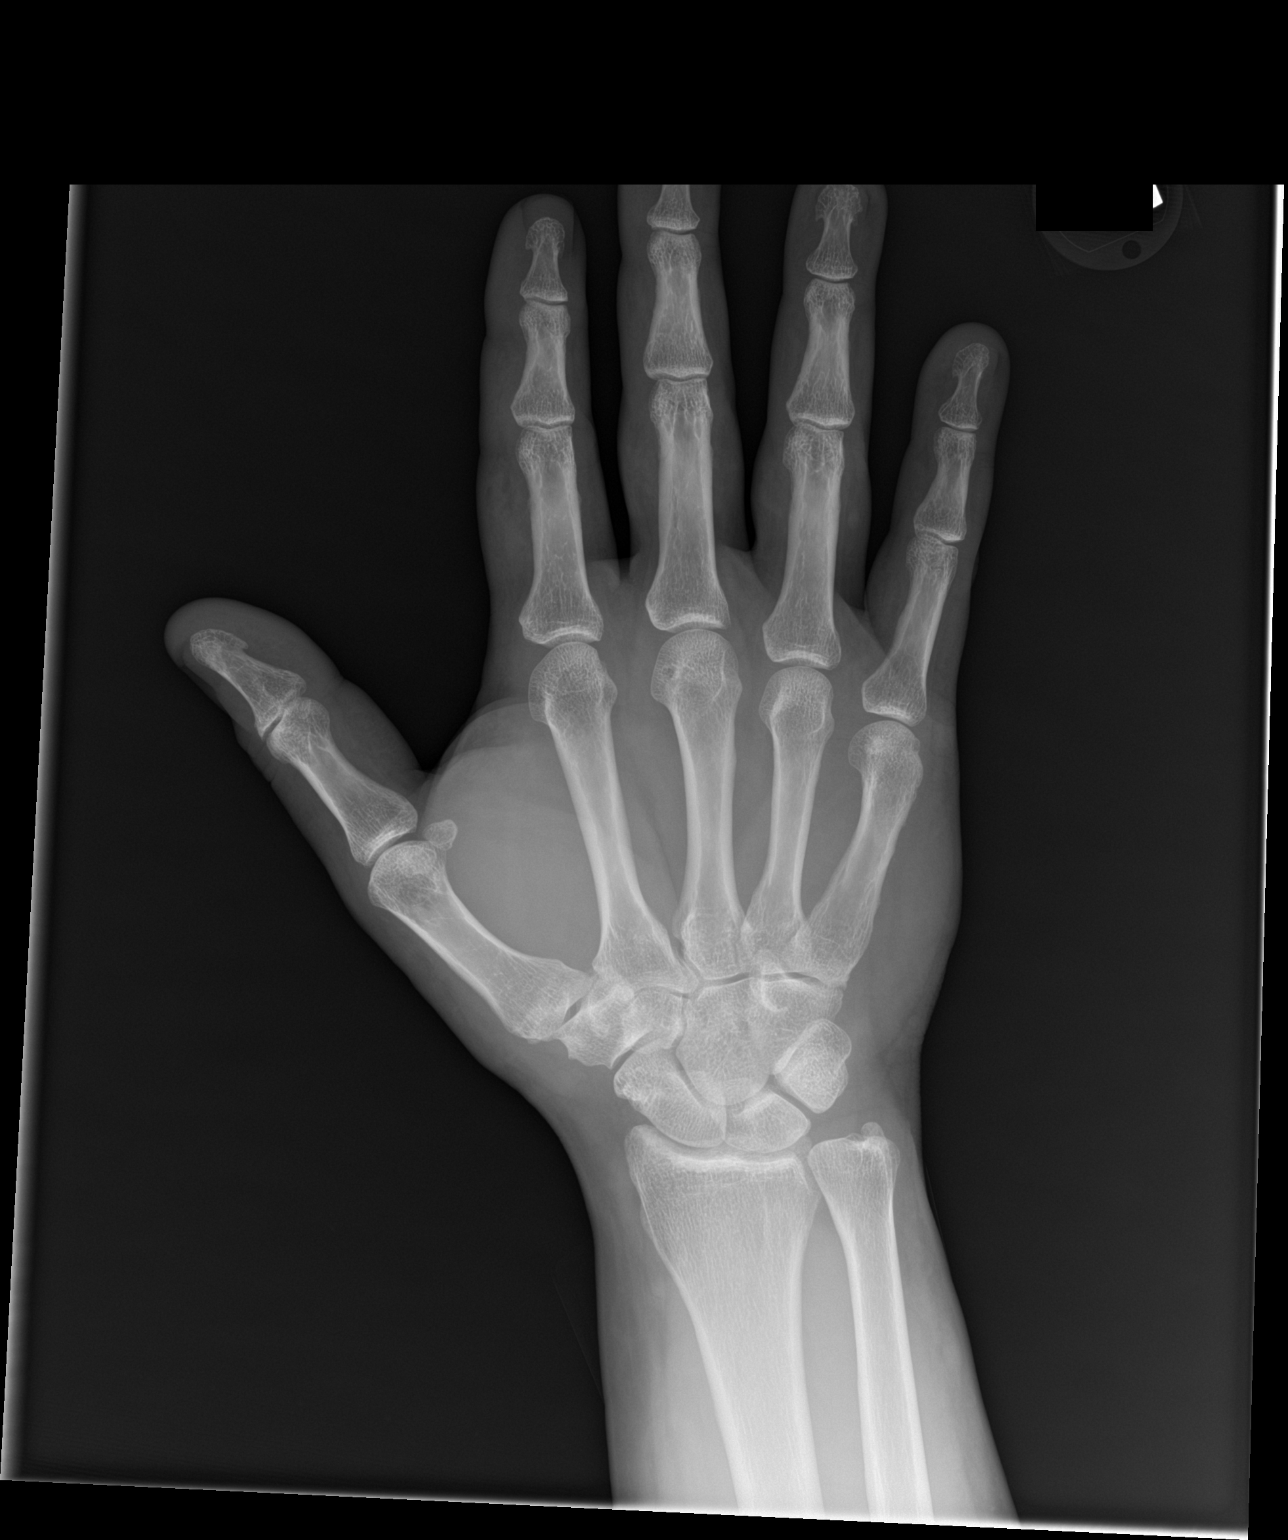

[hand obl]
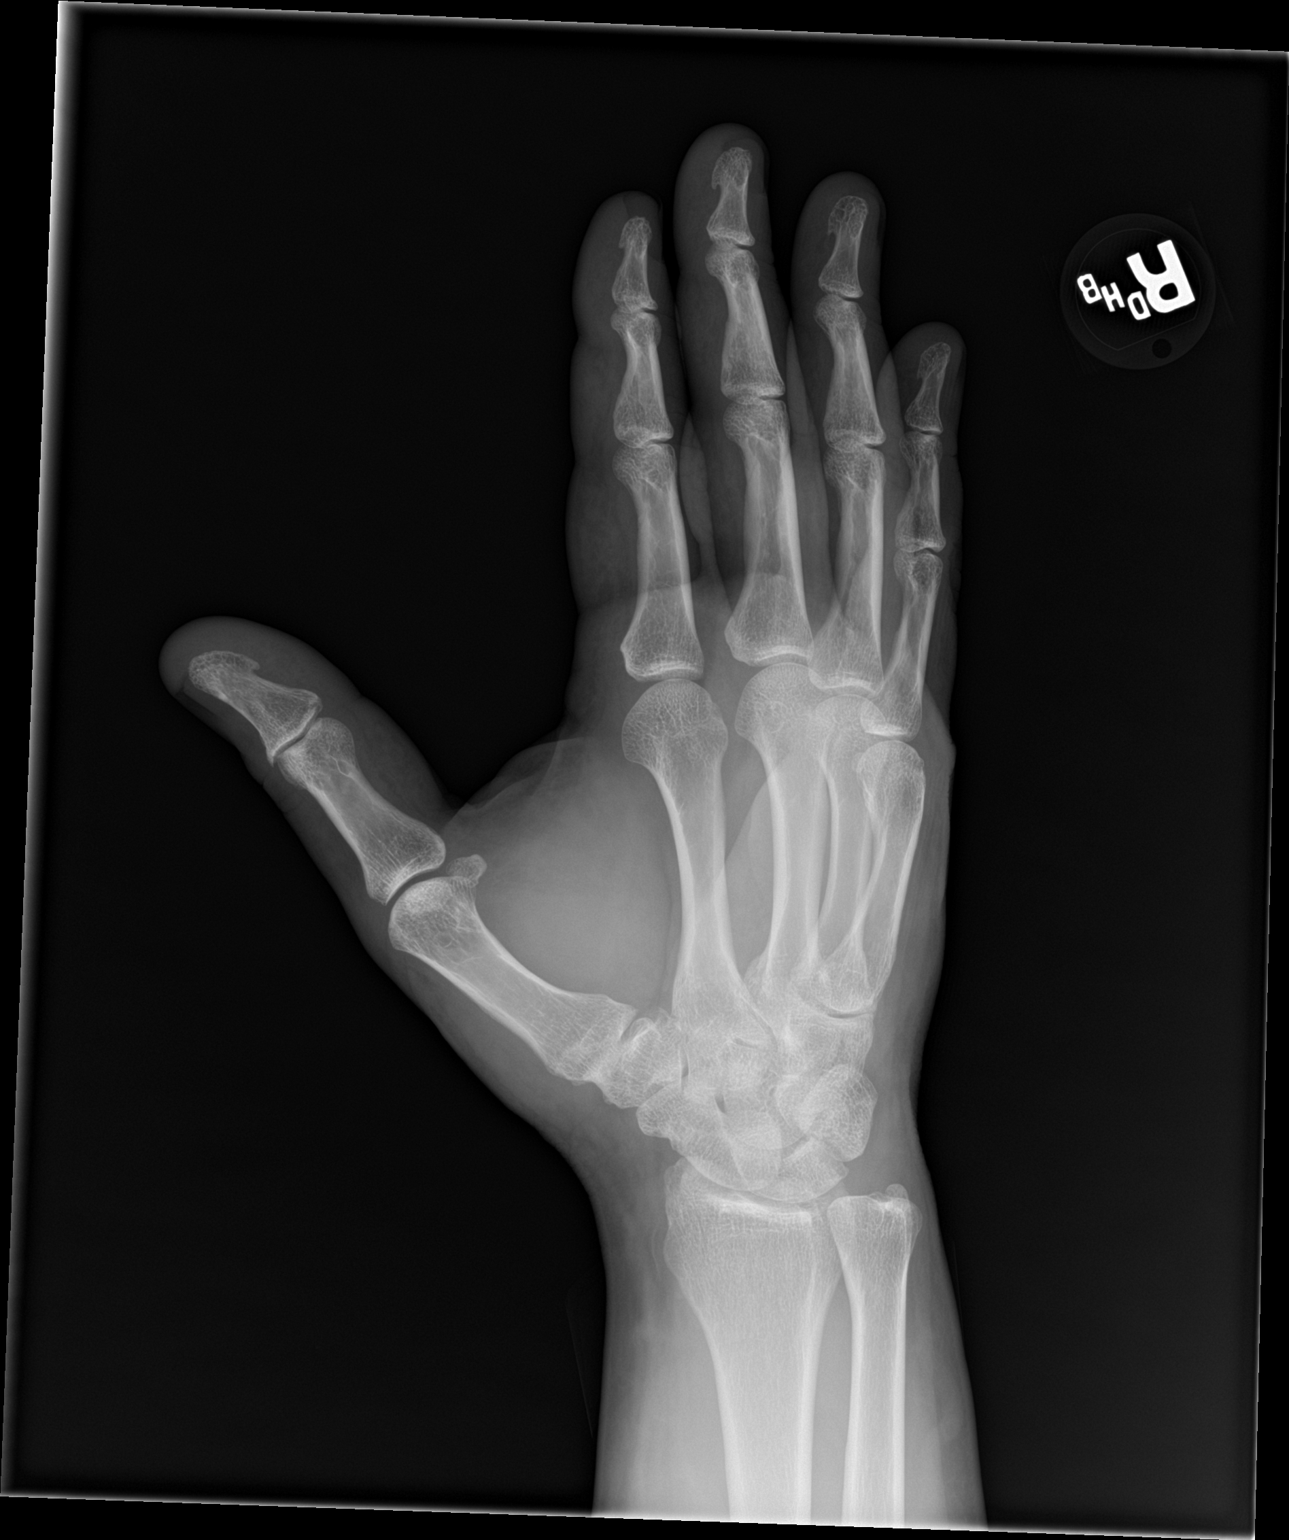

[hand lat]
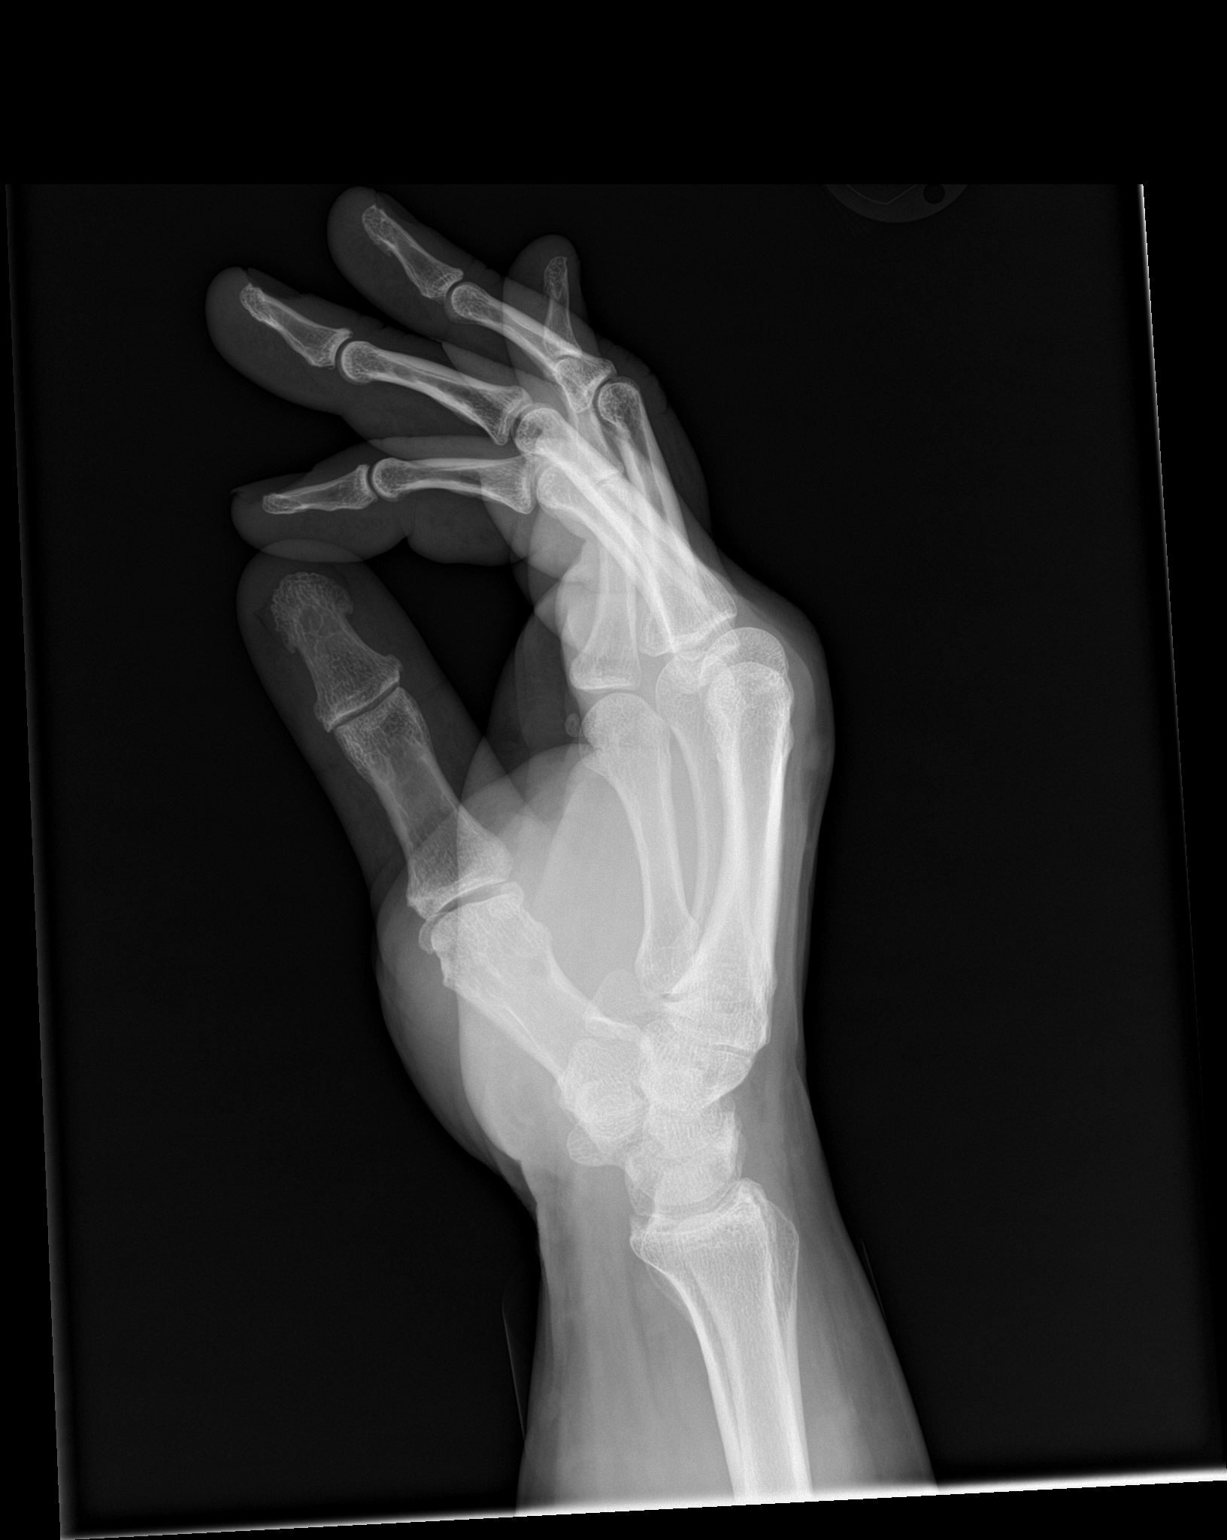

[3 of 3 positions shown; findings below may reference images not displayed]

FINDINGS: There is no evidence of fracture or dislocation. There is no
evidence of arthropathy or other focal bone abnormality. Soft
tissues are unremarkable.
IMPRESSION: Negative.

## 2019-12-26 ENCOUNTER — Encounter: Payer: Self-pay | Admitting: Family Medicine

## 2019-12-26 DIAGNOSIS — I10 Essential (primary) hypertension: Secondary | ICD-10-CM

## 2019-12-27 MED ORDER — AMLODIPINE BESYLATE 10 MG PO TABS: 10.0000 mg | ORAL_TABLET | Freq: Every day | ORAL | 11 refills | Status: DC

## 2019-12-27 MED ORDER — HYDROCHLOROTHIAZIDE 25 MG PO TABS: 25.0000 mg | ORAL_TABLET | Freq: Every day | ORAL | 11 refills | Status: DC

## 2019-12-27 MED ORDER — LISINOPRIL 40 MG PO TABS: ORAL_TABLET | ORAL | 11 refills | Status: DC

## 2020-03-13 ENCOUNTER — Encounter: Payer: Self-pay | Admitting: Family Medicine

## 2020-03-13 DIAGNOSIS — G4709 Other insomnia: Secondary | ICD-10-CM

## 2020-03-14 MED ORDER — ZOLPIDEM TARTRATE 10 MG PO TABS: ORAL_TABLET | ORAL | 1 refills | Status: DC

## 2020-04-09 NOTE — Progress Notes (Addendum)
[facility] [place], [place], [place] [address] [phone]  Date:  04/11/2020   Name:  Brian Perkins   DOB:  1965/05/26   MRN:  [id number]  PCP:  Darreld Mclean, MD    Chief Complaint: Loss of Consciousness (Sycope episodes, feels "clammy" no chest pain)   History of Present Illness:  Brian Perkins is a 54 y.o. 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   About one month ago he started to feel faint while in the barbershop.  He excused himself to the restroom, was standing up, trying to undo his pants when he had LOC, hit his head on the wall and fell to the ground.  He seemed to wake up right away.  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 There was mild focal  basal hypertrophy of the  septum. Systolic function was normal.  The estimated ejection fraction was in the range of 55% to 60%.  Wall motion was normal; there were no regional wall motion  abnormalities. Doppler parameters are consistent with abnormal  left ventricular relaxation (grade 1 diastolic dysfunction).  - Aortic root: The aortic root was mildly to moderately dilated.   Hep C screening- ordered for him today  Colon cancer screening; needs cologuard re-ordered, he did not end up doing hit kit and lost it last year  Flu vaccine- do today  covid series- done, needs boster-he plans to do so soon as possible Most recent labs one year ago 03/14/2020  03/14/2020   1  Zolpidem Tartrate 10 Mg Tablet  30.00  30  [facility]  [id number]  [place] ([id number])  0/1  0.50 LME  Comm Ins  [place]    06/24/2019  04/27/2019   1  Zolpidem Tartrate 10 Mg Tablet  30.00  30  Je Cop  [id number]  Nor ([id number])  2/2  0.50 LME  Comm Ins  [place]    05/25/2019  04/27/2019   1  Zolpidem Tartrate 10 Mg Tablet  30.00  30  Je Cop  [id number]  [place] ([id number])  1/2  0.50 LME  Comm Ins  [place]    04/27/2019  04/27/2019   1  Zolpidem Tartrate 10 Mg Tablet  30.00  [place]  [id number]  Nor [phone])  0/2  0.50 LME  Comm Ins  [place]    03/29/2019  03/29/2019   1  Zolpidem Tartrate 10 Mg Tablet  30.00  30  [facility]  [id number]  [place] ([id number])  0/0  0.50 LME  Comm Ins  [place]    03/02/2019  03/02/2019   1  Zolpidem Tartrate 10 Mg Tablet  30.00  30  Je Cop  [id number]         He also notes that he occasionally uses a cough syrup for an annoying cough, would like a refill of his hydrocodone syrup-I last refilled this over a year ago so this is reasonable   Patient Active Problem List   Diagnosis Date Noted  . Sepsis ([place]) 04/27/2016  . Obesity 08/28/2014  . Hypogonadism male 07/21/2013  . Essential hypertension, benign 07/11/2011    Past Medical History:  Diagnosis Date  . Allergy   . Hypertension   . Hypogonadism male     Past Surgical History:  Procedure Laterality Date  .  CHOLECYSTECTOMY      Social History   Tobacco Use  . Smoking status: Former Smoker    Types: Cigarettes, Cigars    Quit date: 04/14/1989    Years since quitting: 31.0  . Smokeless tobacco: Former Systems developer  . Tobacco comment: not much of smoker  Substance Use Topics  . Alcohol use: Yes    Comment: rarely 1 or 2 beers  . Drug use: No    Family History  Problem Relation Age of Onset  . Diabetes Mother   . Hyperlipidemia Father   . Hypertension Father     No Known Allergies  Medication list has been reviewed and updated.  Current Outpatient Medications on File Prior to Visit  Medication Sig Dispense Refill  . acetaminophen (TYLENOL) 325 MG tablet Take 650 mg by mouth every 6 (six) hours as needed.    Marland Kitchen amLODipine (NORVASC) 10 MG tablet Take 1 tablet (10 mg total) by mouth daily. 30 tablet 11  . aspirin 81 MG tablet Take 81 mg by mouth daily.    . Fish Oil OIL by Does not apply route daily.    . Glucosamine HCl-MSM (GLUCOSAMINE-MSM PO) Take 1,000 mg by mouth daily.    . hydrochlorothiazide (HYDRODIURIL) 25 MG tablet Take 1 tablet (25 mg total) by mouth daily. 30 tablet 11  . KLOR-CON M20 20 MEQ tablet TAKE 1 TABLET BY MOUTH DAILY. TAKE FOR 5 DAYS, THEN HOLD THE REST OF RX IN CASE NEEDED LATER 90 tablet 1  . lisinopril (ZESTRIL) 40 MG tablet TAKE 1 TABLET BY MOUTH EVERY DAY 30 tablet 11  . rosuvastatin (CRESTOR) 10 MG tablet Take 1 tablet (10 mg total) by mouth daily. 90 tablet 3  . Triamcinolone Acetonide (NASACORT AQ NA) Place into the nose.    . zolpidem (AMBIEN) 10 MG tablet Take 1/2 to 1 at bedtime as needed for insomnia 30 tablet 1  . fluticasone (FLONASE) 50 MCG/ACT nasal spray Place 2 sprays into the nose daily. 16 g 6   No current facility-administered medications on file prior to visit.    Review of Systems:  As per HPI- otherwise negative.   Physical Examination: Vitals:   04/11/20 0841  BP: 118/80  Pulse: (!) 110  Resp: 17  SpO2: 99%   Vitals:   04/11/20  0841  Weight: 241 lb (109.3 kg)  Height: 6' (1.829 m)   Body mass index  is 32.69 kg/m. Ideal Body Weight: Weight in (lb) to have BMI = 25: 183.9  GEN: no acute distress. Obese, looks well  HEENT: Atraumatic, Normocephalic.  Recnet operation on left TM  Ears and Nose: No external deformity. CV: Heart rate is slow irregularly irregular with mild tachycardia, No M/G/R. No JVD. No thrill. No extra heart sounds. PULM: CTA B, no wheezes, crackles, rhonchi. No retractions. No resp. distress. No accessory muscle use. ABD: S, NT, ND, +BS. No rebound. No HSM. EXTR: No c/c/e PSYCH: Normally interactive. Conversant.   EKG: sinus tach with ectopic beats, no evidence of A fib -discussed with doc of the day, Dr. Radford Pax who kindly spoke with me on the phone.  She agrees this is sinus rhythm with ectopic beats.  We made a plan for close cardiology follow-up, in the meantime I will obtain an echocardiogram and order event monitor Assessment and Plan: Physical exam  Dyslipidemia - Plan: Lipid panel, rosuvastatin (CRESTOR) 10 MG tablet  Essential hypertension, benign - Plan: CBC, Comprehensive metabolic panel  Screening for diabetes mellitus - Plan: Comprehensive metabolic panel, Hemoglobin A1c  Screening for colon cancer - Plan: Cologuard  Screening for prostate cancer - Plan: PSA  Pre-syncope - Plan: CBC, Hemoglobin A1c, TSH, Ambulatory referral to Cardiac Electrophysiology  Syncope, unspecified syncope type  Encounter for hepatitis C screening test for low risk patient - Plan: Hepatitis C antibody  Irregularly irregular pulse rhythm - Plan: EKG 12-Lead, ECHOCARDIOGRAM COMPLETE, Cardiac event monitor  Need for influenza vaccination - Plan: Flu Vaccine QUAD 36+ mos IM  Tachycardia - Plan: Ambulatory referral to Cardiac Electrophysiology  Cough - Plan: HYDROcodone-homatropine (HYCODAN) 5-1.5 MG/5ML syrup  Patient today for physical exam, he also has concern of recent syncopal  episode. Health maintenance-labs pain as above, discussed immunizations, order Cologuard.  Encouraged healthy diet and exercise routine He notes cough and occasionally uses Hycodan, refill this for him today For syncopal episode, referral to electrophysiology, ordered echo and event monitor.  Recommended that patient not drive or engage in any activities that could be hazardous if syncope occurred (stay off ladders, etc.) until evaluation is complete.  Thankfully the patient does seem to have warning symptoms prior to syncope so he should be able to get in a safe position and location before loss of consciousness.  He will let me know if he has any further episodes  Will plan further follow- up pending labs.  This visit occurred during the SARS-CoV-2 public health emergency.  Safety protocols were in place, including screening questions prior to the visit, additional usage of staff PPE, and extensive cleaning of exam room while observing appropriate contact time as indicated for disinfecting solutions.     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 Wife is wondering if that has anything to do with him passing out.  Results for orders placed or performed in visit on 04/11/20  CBC  Result Value Ref Range   WBC 7.8 4.0 - 10.5 K/uL   RBC 5.99 (H) 4.22 - 5.81 Mil/uL   Platelets 248.0 150.0 - 400.0 K/uL   Hemoglobin 16.8 13.0 - 17.0 g/dL   HCT 49.0 39.0 - 52.0 %   MCV 81.7 78.0 - 100.0 fl   MCHC 34.2 30.0 - 36.0 g/dL  RDW 13.9 11.5 - 15.5 %  Comprehensive metabolic panel  Result Value Ref Range   Sodium 136 135 - 145 mEq/L   Potassium 3.7 3.5 - 5.1 mEq/L   Chloride 98 96 - 112 mEq/L   CO2 30 19 - 32 mEq/L   Glucose, Bld 102 (H) 70 - 99 mg/dL   BUN 15 6 - 23 mg/dL   Creatinine,  Ser 1.37 0.40 - 1.50 mg/dL   Total Bilirubin 1.0 0.2 - 1.2 mg/dL   Alkaline Phosphatase 79 39 - 117 U/L   AST 39 (H) 0 - 37 U/L   ALT 53 0 - 53 U/L   Total Protein 7.6 6.0 - 8.3 g/dL   Albumin 4.4 3.5 - 5.2 g/dL   GFR 58.31 (L) >60.00 mL/min   Calcium 9.7 8.4 - 10.5 mg/dL  Hemoglobin A1c  Result Value Ref Range   Hgb A1c MFr Bld 5.6 4.6 - 6.5 %  Lipid panel  Result Value Ref Range   Cholesterol 174 0 - 200 mg/dL   Triglycerides 117.0 0.0 - 149.0 mg/dL   HDL 45.80 >39.00 mg/dL   VLDL 23.4 0.0 - 40.0 mg/dL   LDL Cholesterol 105 (H) 0 - 99 mg/dL   Total CHOL/HDL Ratio 4    NonHDL 128.26   PSA  Result Value Ref Range   PSA 1.24 0.10 - 4.00 ng/mL  TSH  Result Value Ref Range   TSH 2.32 0.35 - 4.50 uIU/mL

## 2020-04-09 NOTE — Patient Instructions (Addendum)
Good to see you again today, I will be in touch with your labs asap Please get your covid 19 booster asap You got your flu shot today We will order a new cologuard kit for you- please complete this asap   As far as your fainting episodes, we are going to have you see cardiology, I will set up an echocardiogram and we will also order an "event monitor"- this is a wearable heart monitor which we use in hopes of "catching you" if you have an episode of arrhythmia which might be responsible for your fainting To be safest I would recommend that you not drive until we get this figured out  Please let me know if you have any more episodes and take care!      Health Maintenance, Male Adopting a healthy lifestyle and getting preventive care are important in promoting health and wellness. Ask your health care provider about:  The right schedule for you to have regular tests and exams.  Things you can do on your own to prevent diseases and keep yourself healthy. What should I know about diet, weight, and exercise? Eat a healthy diet   Eat a diet that includes plenty of vegetables, fruits, low-fat dairy products, and lean protein.  Do not eat a lot of foods that are high in solid fats, added sugars, or sodium. Maintain a healthy weight Body mass index (BMI) is a measurement that can be used to identify possible weight problems. It estimates body fat based on height and weight. Your health care provider can help determine your BMI and help you achieve or maintain a healthy weight. Get regular exercise Get regular exercise. This is one of the most important things you can do for your health. Most adults should:  Exercise for at least 150 minutes each week. The exercise should increase your heart rate and make you sweat (moderate-intensity exercise).  Do strengthening exercises at least twice a week. This is in addition to the moderate-intensity exercise.  Spend less time sitting. Even light  physical activity can be beneficial. Watch cholesterol and blood lipids Have your blood tested for lipids and cholesterol at 54 years of age, then have this test every 5 years. You may need to have your cholesterol levels checked more often if:  Your lipid or cholesterol levels are high.  You are older than 54 years of age.  You are at high risk for heart disease. What should I know about cancer screening? Many types of cancers can be detected early and may often be prevented. Depending on your health history and family history, you may need to have cancer screening at various ages. This may include screening for:  Colorectal cancer.  Prostate cancer.  Skin cancer.  Lung cancer. What should I know about heart disease, diabetes, and high blood pressure? Blood pressure and heart disease  High blood pressure causes heart disease and increases the risk of stroke. This is more likely to develop in people who have high blood pressure readings, are of African descent, or are overweight.  Talk with your health care provider about your target blood pressure readings.  Have your blood pressure checked: ? Every 3-5 years if you are 61-66 years of age. ? Every year if you are 41 years old or older.  If you are between the ages of 23 and 27 and are a current or former smoker, ask your health care provider if you should have a one-time screening for abdominal aortic aneurysm (AAA).  Diabetes Have regular diabetes screenings. This checks your fasting blood sugar level. Have the screening done:  Once every three years after age 93 if you are at a normal weight and have a low risk for diabetes.  More often and at a younger age if you are overweight or have a high risk for diabetes. What should I know about preventing infection? Hepatitis B If you have a higher risk for hepatitis B, you should be screened for this virus. Talk with your health care provider to find out if you are at risk for  hepatitis B infection. Hepatitis C Blood testing is recommended for:  Everyone born from [birth year] through 1965.  Anyone with known risk factors for hepatitis C. Sexually transmitted infections (STIs)  You should be screened each year for STIs, including gonorrhea and chlamydia, if: ? You are sexually active and are younger than 54 years of age. ? You are older than 54 years of age and your health care provider tells you that you are at risk for this type of infection. ? Your sexual activity has changed since you were last screened, and you are at increased risk for chlamydia or gonorrhea. Ask your health care provider if you are at risk.  Ask your health care provider about whether you are at high risk for HIV. Your health care provider may recommend a prescription medicine to help prevent HIV infection. If you choose to take medicine to prevent HIV, you should first get tested for HIV. You should then be tested every 3 months for as long as you are taking the medicine. Follow these instructions at home: Lifestyle  Do not use any products that contain nicotine or tobacco, such as cigarettes, e-cigarettes, and chewing tobacco. If you need help quitting, ask your health care provider.  Do not use street drugs.  Do not share needles.  Ask your health care provider for help if you need support or information about quitting drugs. Alcohol use  Do not drink alcohol if your health care provider tells you not to drink.  If you drink alcohol: ? Limit how much you have to 0-2 drinks a day. ? Be aware of how much alcohol is in your drink. In the [place] one drink equals one 12 oz bottle of beer (355 mL), one 5 oz glass of wine (148 mL), or one 1 oz glass of hard liquor (44 mL). General instructions  Schedule regular health, dental, and eye exams.  Stay current with your vaccines.  Tell your health care provider if: ? You often feel depressed. ? You have ever been abused or do not feel safe at  home. Summary  Adopting a healthy lifestyle and getting preventive care are important in promoting health and wellness.  Follow your health care provider's instructions about healthy diet, exercising, and getting tested or screened for diseases.  Follow your health care provider's instructions on monitoring your cholesterol and blood pressure. This information is not intended to replace advice given to you by your health care provider. Make sure you discuss any questions you have with your health care provider. Document Revised: 03/24/2018 Document Reviewed: 03/24/2018 Elsevier Patient Education  2020 [employer].

## 2020-04-11 ENCOUNTER — Telehealth: Payer: Self-pay | Admitting: Family Medicine

## 2020-04-11 ENCOUNTER — Ambulatory Visit (INDEPENDENT_AMBULATORY_CARE_PROVIDER_SITE_OTHER): Payer: BC Managed Care – PPO | Admitting: Family Medicine

## 2020-04-11 ENCOUNTER — Encounter: Payer: Self-pay | Admitting: Family Medicine

## 2020-04-11 ENCOUNTER — Other Ambulatory Visit: Payer: Self-pay

## 2020-04-11 VITALS — BP 118/80 | HR 110 | Resp 17 | Ht 72.0 in | Wt 241.0 lb

## 2020-04-11 DIAGNOSIS — E785 Hyperlipidemia, unspecified: Secondary | ICD-10-CM

## 2020-04-11 DIAGNOSIS — R059 Cough, unspecified: Secondary | ICD-10-CM

## 2020-04-11 DIAGNOSIS — Z23 Encounter for immunization: Secondary | ICD-10-CM | POA: Diagnosis not present

## 2020-04-11 DIAGNOSIS — Z0000 Encounter for general adult medical examination without abnormal findings: Secondary | ICD-10-CM

## 2020-04-11 DIAGNOSIS — Z125 Encounter for screening for malignant neoplasm of prostate: Secondary | ICD-10-CM

## 2020-04-11 DIAGNOSIS — Z131 Encounter for screening for diabetes mellitus: Secondary | ICD-10-CM | POA: Diagnosis not present

## 2020-04-11 DIAGNOSIS — R55 Syncope and collapse: Secondary | ICD-10-CM | POA: Diagnosis not present

## 2020-04-11 DIAGNOSIS — I499 Cardiac arrhythmia, unspecified: Secondary | ICD-10-CM

## 2020-04-11 DIAGNOSIS — Z1211 Encounter for screening for malignant neoplasm of colon: Secondary | ICD-10-CM | POA: Diagnosis not present

## 2020-04-11 DIAGNOSIS — Z1159 Encounter for screening for other viral diseases: Secondary | ICD-10-CM

## 2020-04-11 DIAGNOSIS — R944 Abnormal results of kidney function studies: Secondary | ICD-10-CM

## 2020-04-11 DIAGNOSIS — R000 Tachycardia, unspecified: Secondary | ICD-10-CM

## 2020-04-11 DIAGNOSIS — I10 Essential (primary) hypertension: Secondary | ICD-10-CM | POA: Diagnosis not present

## 2020-04-11 LAB — COMPREHENSIVE METABOLIC PANEL
ALT: 53 U/L (ref 0–53)
AST: 39 U/L — ABNORMAL HIGH (ref 0–37)
Albumin: 4.4 g/dL (ref 3.5–5.2)
Alkaline Phosphatase: 79 U/L (ref 39–117)
BUN: 15 mg/dL (ref 6–23)
CO2: 30 mEq/L (ref 19–32)
Calcium: 9.7 mg/dL (ref 8.4–10.5)
Chloride: 98 mEq/L (ref 96–112)
Creatinine, Ser: 1.37 mg/dL (ref 0.40–1.50)
GFR: 58.31 mL/min — ABNORMAL LOW (ref >60.00)
Glucose, Bld: 102 mg/dL — ABNORMAL HIGH (ref 70–99)
Potassium: 3.7 mEq/L (ref 3.5–5.1)
Sodium: 136 mEq/L (ref 135–145)
Total Bilirubin: 1 mg/dL (ref 0.2–1.2)
Total Protein: 7.6 g/dL (ref 6.0–8.3)

## 2020-04-11 LAB — TSH: TSH: 2.32 u[IU]/mL (ref 0.35–4.50)

## 2020-04-11 LAB — LIPID PANEL
Cholesterol: 174 mg/dL (ref 0–200)
HDL: 45.8 mg/dL (ref >39.00)
LDL Cholesterol: 105 mg/dL — ABNORMAL HIGH (ref 0–99)
NonHDL: 128.26
Total CHOL/HDL Ratio: 4
Triglycerides: 117 mg/dL (ref 0.0–149.0)
VLDL: 23.4 mg/dL (ref 0.0–40.0)

## 2020-04-11 LAB — PSA: PSA: 1.24 ng/mL (ref 0.10–4.00)

## 2020-04-11 LAB — CBC
HCT: 49 % (ref 39.0–52.0)
Hemoglobin: 16.8 g/dL (ref 13.0–17.0)
MCHC: 34.2 g/dL (ref 30.0–36.0)
MCV: 81.7 fl (ref 78.0–100.0)
Platelets: 248 10*3/uL (ref 150.0–400.0)
RBC: 5.99 Mil/uL — ABNORMAL HIGH (ref 4.22–5.81)
RDW: 13.9 % (ref 11.5–15.5)
WBC: 7.8 10*3/uL (ref 4.0–10.5)

## 2020-04-11 LAB — HEMOGLOBIN A1C: Hgb A1c MFr Bld: 5.6 % (ref 4.6–6.5)

## 2020-04-11 MED ORDER — ROSUVASTATIN CALCIUM 10 MG PO TABS: 10.0000 mg | ORAL_TABLET | Freq: Every day | ORAL | 3 refills | Status: DC

## 2020-04-11 MED ORDER — HYDROCODONE-HOMATROPINE 5-1.5 MG/5ML PO SYRP: 5.0000 mL | ORAL_SOLUTION | Freq: Three times a day (TID) | ORAL | 0 refills | Status: DC | PRN

## 2020-04-11 NOTE — Addendum Note (Signed)
Addended by: Pearline Cables on: 04/11/2020 06:28 PM   Modules accepted: Orders

## 2020-04-11 NOTE — Telephone Encounter (Signed)
  Wife is calling to let you know patient had an mri possibly 10+ years ago that show patient had a growth near the pituitary gland. Wife is wondering if that has anything to do with him passing out.

## 2020-04-11 NOTE — Telephone Encounter (Signed)
Please advise 

## 2020-04-12 LAB — HEPATITIS C ANTIBODY
Hepatitis C Ab: NONREACTIVE
SIGNAL TO CUT-OFF: 0.01 (ref <1.00)

## 2020-04-13 MED ORDER — HYDROCODONE-ACETAMINOPHEN 5-325 MG PO TABS: 1.0000 | ORAL_TABLET | Freq: Three times a day (TID) | ORAL | 0 refills | Status: DC | PRN

## 2020-04-13 NOTE — Addendum Note (Signed)
Addended by: Abbe Amsterdam C on: 04/13/2020 12:43 PM   Modules accepted: Orders

## 2020-04-16 ENCOUNTER — Telehealth: Payer: Self-pay | Admitting: Family Medicine

## 2020-04-16 NOTE — Telephone Encounter (Signed)
[employer] that was on file termed 04/13/2020. I have left a message for pt to call back with current insurance information so I am able to obtain auth for echocardiogram.

## 2020-04-18 NOTE — Telephone Encounter (Signed)
1/5 KO left 2nd msg for pt to call with updated insurance

## 2020-04-26 ENCOUNTER — Institutional Professional Consult (permissible substitution): Payer: BC Managed Care – PPO | Admitting: Cardiology

## 2020-05-08 ENCOUNTER — Institutional Professional Consult (permissible substitution): Payer: Self-pay | Admitting: Cardiology

## 2020-05-21 ENCOUNTER — Encounter: Payer: Self-pay | Admitting: Family Medicine

## 2020-05-22 ENCOUNTER — Encounter: Payer: Self-pay | Admitting: Cardiology

## 2020-05-22 ENCOUNTER — Other Ambulatory Visit: Payer: Self-pay

## 2020-05-22 ENCOUNTER — Ambulatory Visit (INDEPENDENT_AMBULATORY_CARE_PROVIDER_SITE_OTHER): Payer: BC Managed Care – PPO | Admitting: Cardiology

## 2020-05-22 VITALS — BP 172/94 | HR 70 | Ht 72.0 in | Wt 258.0 lb

## 2020-05-22 DIAGNOSIS — I77810 Thoracic aortic ectasia: Secondary | ICD-10-CM | POA: Diagnosis not present

## 2020-05-22 DIAGNOSIS — R55 Syncope and collapse: Secondary | ICD-10-CM

## 2020-05-22 DIAGNOSIS — I10 Essential (primary) hypertension: Secondary | ICD-10-CM

## 2020-05-22 DIAGNOSIS — R000 Tachycardia, unspecified: Secondary | ICD-10-CM | POA: Insufficient documentation

## 2020-05-22 NOTE — Patient Instructions (Addendum)
Medication Instructions:  Your physician has recommended you make the following change in your medication:   ** Restart Amlodipine 10mg  - 1 tablet by mouth daily ** Hydrochlorothiazide 25mg  - 1 tablet by mouth daily   *If you need a refill on your cardiac medications before your next appointment, please call your pharmacy*   Lab Work: None ordered.  If you have labs (blood work) drawn today and your tests are completely normal, you will receive your results only by: MyChart Message (if you have MyChart) OR . A paper copy in the mail If you have any lab test that is abnormal or we need to change your treatment, we will call you to review the results.   Testing/Procedures: Your physician has requested that you have an echocardiogram. Echocardiography is a painless test that uses sound waves to create images of your heart. It provides your doctor with information about the size and shape of your heart and how well your heart's chambers and valves are working. This procedure takes approximately one hour. There are no restrictions for this procedure.    Follow-Up: At [facility], you and your health needs are our priority.  As part of our continuing mission to provide you with exceptional heart care, we have created designated Provider Care Teams.  These Care Teams include your primary Cardiologist (physician) and Advanced Practice Providers (APPs -  Physician Assistants and Nurse Practitioners) who all work together to provide you with the care you need, when you need it.  We recommend signing up for the patient portal called "MyChart".  Sign up information is provided on this After Visit Summary.  MyChart is used to connect with patients for Virtual Visits (Telemedicine).  Patients are able to view lab/test results, encounter notes, upcoming appointments, etc.  Non-urgent messages can be sent to your provider as well.   To learn more about what you can do with MyChart, go to  Marland Kitchen.    Your next appointment:   07/24/2020 at 8:15am

## 2020-05-22 NOTE — Progress Notes (Signed)
Electrophysiology Office Note:    Date:  05/22/2020   ID:  Brian Perkins, DOB 22-Oct-1965, MRN [id number]  PCP:  Pearline Cables, MD  [facility] Cardiologist:  No primary care provider on file.  [facility] Electrophysiologist:  Lanier Prude, MD   Referring MD: Pearline Cables, MD   Chief Complaint: Presyncope  History of Present Illness:    Brian Perkins is a 55 y.o. male who presents for an evaluation of presyncope at the request of Dr. Patsy Lager. Their medical history includes hypertension.  He last saw the Dr. Cleatrice Burke and April 11, 2020.  At that appointment he described 2 near syncopal episodes.  The first was approximately 1 year ago and occurred while he was car shopping. He works as a Emergency planning/management officer at [employer].  He had another episode about 9 months ago.  He was standing at the time and felt an intense warmth, over him before he had a near syncopal episode.  He tells me that the whole time he is "out" he can hear what people are saying around him but is just unable to respond.  Both episodes are in the setting of prolonged standing.  Past Medical History:  Diagnosis Date  . Allergy   . Hypertension   . Hypogonadism male     Past Surgical History:  Procedure Laterality Date  . CHOLECYSTECTOMY      Current Medications: Current Meds  Medication Sig  . acetaminophen (TYLENOL) 325 MG tablet Take 650 mg by mouth every 6 (six) hours as needed.  Marland Kitchen amLODipine (NORVASC) 10 MG tablet Take 1 tablet (10 mg total) by mouth daily.  Marland Kitchen aspirin 81 MG tablet Take 81 mg by mouth daily.  . Fish Oil OIL by Does not apply route daily.  . Glucosamine HCl-MSM (GLUCOSAMINE-MSM PO) Take 1,000 mg by mouth daily.  . hydrochlorothiazide (HYDRODIURIL) 25 MG tablet Take 1 tablet (25 mg total) by mouth daily.  Marland Kitchen HYDROcodone-acetaminophen (NORCO/VICODIN) 5-325 MG tablet Take 1 tablet by mouth every 8 (eight) hours as needed.  Marland Kitchen lisinopril (ZESTRIL) 40 MG  tablet TAKE 1 TABLET BY MOUTH EVERY DAY  . rosuvastatin (CRESTOR) 10 MG tablet Take 1 tablet (10 mg total) by mouth daily.  . Triamcinolone Acetonide (NASACORT AQ NA) Place into the nose.  . zolpidem (AMBIEN) 10 MG tablet Take 1/2 to 1 at bedtime as needed for insomnia     Allergies:   Patient has no known allergies.   Social History   Socioeconomic History  . Marital status: Married    Spouse name: Not on file  . Number of children: Not on file  . Years of education: Not on file  . Highest education level: Not on file  Occupational History  . Not on file  Tobacco Use  . Smoking status: Former Smoker    Types: Cigarettes, Cigars    Quit date: 04/14/1989    Years since quitting: 31.1  . Smokeless tobacco: Former Neurosurgeon  . Tobacco comment: not much of smoker  Substance and Sexual Activity  . Alcohol use: Yes    Comment: rarely 1 or 2 beers  . Drug use: No  . Sexual activity: Not on file  Other Topics Concern  . Not on file  Social History Narrative  . Not on file   Social Determinants of Health   Financial Resource Strain: Not on file  Food Insecurity: Not on file  Transportation Needs: Not on file  Physical Activity: Not  on file  Stress: Not on file  Social Connections: Not on file     Family History: The patient's family history includes Diabetes in his mother; Hyperlipidemia in his father; Hypertension in his father.  ROS:   Please see the history of present illness.    All other systems reviewed and are negative.  EKGs/Labs/Other Studies Reviewed:    The following studies were reviewed today:  Aug 31, 2014 echo personally reviewed Focal hypertrophy of the basal septum Left ventricular function normal 55% Mild to moderately dilated aortic root (4.5 cm).  Difficult to appreciate this larger by measurement on previous echo.   EKG:  The ekg ordered today demonstrates sinus rhythm.  Normal intervals.  No preexcitation.  Recent Labs: 04/11/2020: ALT 53; BUN  15; Creatinine, Ser 1.37; Hemoglobin 16.8; Platelets 248.0; Potassium 3.7; Sodium 136; TSH 2.32  Recent Lipid Panel    Component Value Date/Time   CHOL 174 04/11/2020 0952   TRIG 117.0 04/11/2020 0952   HDL 45.80 04/11/2020 0952   CHOLHDL 4 04/11/2020 0952   VLDL 23.4 04/11/2020 0952   LDLCALC 105 (H) 04/11/2020 0952    Physical Exam:    VS:  BP (!) 172/94   Pulse 70   Ht 6' (1.829 m)   Wt 258 lb (117 kg)   SpO2 93%   BMI 34.99 kg/m     Wt Readings from Last 3 Encounters:  05/22/20 258 lb (117 kg)  04/11/20 241 lb (109.3 kg)  04/04/19 280 lb (127 kg)     GEN:  Well nourished, well developed in no acute distress HEENT: Normal NECK: No JVD; No carotid bruits LYMPHATICS: No lymphadenopathy CARDIAC: RRR, no murmurs, rubs, gallops RESPIRATORY:  Clear to auscultation without rales, wheezing or rhonchi  ABDOMEN: Soft, non-tender, non-distended MUSCULOSKELETAL:  No edema; No deformity  SKIN: Warm and dry NEUROLOGIC:  Alert and oriented x 3 PSYCHIATRIC:  Normal affect   ASSESSMENT:    1. Primary hypertension   2. Aortic root dilation (HCC)   3. Syncope and collapse    PLAN:    In order of problems listed above:  1. HTN Uncontrolled.  He has not been taking his antihypertensive medications.  We will plan to restart his amlodipine 10 mg daily and hydrochlorothiazide 25 mg daily.  He is going to purchase an arm cuff and check blood pressures several times per week and bring those to the next appointment.  His goal blood pressure is a systolic less than 140 mmHg.  2. Near syncope Patient with 2 episodes of what sounds like vasovagal syncope.  These episodes occurred approximately 9 to 12 months ago and were both in the setting of prolonged standing.  He has never had an episode while seated.  He has had no episodes in the last 9 months.  He has never felt any palpitations.  The episodes are preceded by a intense warmth.  I have encouraged him to stay hydrated.  I do not think  a short-term heart monitor is indicated at this time given the infrequent nature of the past events.  If he ever has recurrent syncope, would need to discuss using a heart monitor versus loop recorder implantation.  3. Reported history of aortic Root Dilation  Aortic root dilated on 2016 echocardiogram.  MRI that followed this echo did not show thoracic or abdominal aortic dilation.  Imaging of the root does not look crisp on the MRI.  Plan to repeat the echocardiogram to reassess the aortic root and  also to assess for septal hypertrophy.  Follow-up 6 to 8 weeks.  Medication Adjustments/Labs and Tests Ordered: Current medicines are reviewed at length with the patient today.  Concerns regarding medicines are outlined above.  Orders Placed This Encounter  Procedures  . EKG 12-Lead   No orders of the defined types were placed in this encounter.    Signed, Steffanie Dunn, MD, [facility]  05/22/2020 8:54 AM    Electrophysiology [facility]

## 2020-06-13 ENCOUNTER — Ambulatory Visit (HOSPITAL_COMMUNITY): Payer: BC Managed Care – PPO | Attending: Cardiology

## 2020-06-13 ENCOUNTER — Other Ambulatory Visit: Payer: Self-pay

## 2020-06-13 DIAGNOSIS — I77810 Thoracic aortic ectasia: Secondary | ICD-10-CM | POA: Insufficient documentation

## 2020-06-13 DIAGNOSIS — I10 Essential (primary) hypertension: Secondary | ICD-10-CM | POA: Insufficient documentation

## 2020-06-13 DIAGNOSIS — R55 Syncope and collapse: Secondary | ICD-10-CM | POA: Diagnosis present

## 2020-06-13 LAB — ECHOCARDIOGRAM COMPLETE
Area-P 1/2: 3.91 cm2
S' Lateral: 2.7 cm

## 2020-06-20 ENCOUNTER — Other Ambulatory Visit: Payer: Self-pay | Admitting: Family Medicine

## 2020-06-20 DIAGNOSIS — I10 Essential (primary) hypertension: Secondary | ICD-10-CM

## 2020-07-24 ENCOUNTER — Ambulatory Visit: Payer: BC Managed Care – PPO | Admitting: Cardiology

## 2020-07-24 NOTE — Progress Notes (Unsigned)
Electrophysiology Office Follow up Visit Note:    Date:  07/24/2020   ID:  Brian Perkins, DOB 1966/04/05, MRN [id number]  PCP:  Pearline Cables, MD  [facility] Cardiologist:  No primary care provider on file.  [facility] Electrophysiologist:  Lanier Prude, MD    Interval History:    Brian Perkins is a 55 y.o. male who presents for a follow up visit.  I last saw the patient May 22, 2020 for presyncope.  Based on his history, I felt like his symptoms of presyncope were related to vasovagal syncope.  During that appointment he had uncontrolled hypertension.  We restarted his amlodipine 10 mg daily and hydrochlorothiazide 25 mg daily.  I asked him to check his blood pressures at home several times per week and present today to have his medications titrated further.    Past Medical History:  Diagnosis Date  . Allergy   . Hypertension   . Hypogonadism male     Past Surgical History:  Procedure Laterality Date  . CHOLECYSTECTOMY      Current Medications: No outpatient medications have been marked as taking for the 07/24/20 encounter (Appointment) with Lanier Prude, MD.     Allergies:   Patient has no known allergies.   Social History   Socioeconomic History  . Marital status: Married    Spouse name: Not on file  . Number of children: Not on file  . Years of education: Not on file  . Highest education level: Not on file  Occupational History  . Not on file  Tobacco Use  . Smoking status: Former Smoker    Types: Cigarettes, Cigars    Quit date: 04/14/1989    Years since quitting: 31.2  . Smokeless tobacco: Former Neurosurgeon  . Tobacco comment: not much of smoker  Substance and Sexual Activity  . Alcohol use: Yes    Comment: rarely 1 or 2 beers  . Drug use: No  . Sexual activity: Not on file  Other Topics Concern  . Not on file  Social History Narrative  . Not on file   Social Determinants of Health   Financial Resource Strain: Not on file   Food Insecurity: Not on file  Transportation Needs: Not on file  Physical Activity: Not on file  Stress: Not on file  Social Connections: Not on file     Family History: The patient's family history includes Diabetes in his mother; Hyperlipidemia in his father; Hypertension in his father.  ROS:   Please see the history of present illness.    All other systems reviewed and are negative.  EKGs/Labs/Other Studies Reviewed:    The following studies were reviewed today:   EKG:  The ekg ordered today demonstrates ***  Recent Labs: 04/11/2020: ALT 53; BUN 15; Creatinine, Ser 1.37; Hemoglobin 16.8; Platelets 248.0; Potassium 3.7; Sodium 136; TSH 2.32  Recent Lipid Panel    Component Value Date/Time   CHOL 174 04/11/2020 0952   TRIG 117.0 04/11/2020 0952   HDL 45.80 04/11/2020 0952   CHOLHDL 4 04/11/2020 0952   VLDL 23.4 04/11/2020 0952   LDLCALC 105 (H) 04/11/2020 0952    Physical Exam:    VS:  There were no vitals taken for this visit.    Wt Readings from Last 3 Encounters:  05/22/20 258 lb (117 kg)  04/11/20 241 lb (109.3 kg)  04/04/19 280 lb (127 kg)     GEN: *** Well nourished, well developed in no acute distress  HEENT: Normal NECK: No JVD; No carotid bruits LYMPHATICS: No lymphadenopathy CARDIAC: ***RRR, no murmurs, rubs, gallops RESPIRATORY:  Clear to auscultation without rales, wheezing or rhonchi  ABDOMEN: Soft, non-tender, non-distended MUSCULOSKELETAL:  No edema; No deformity  SKIN: Warm and dry NEUROLOGIC:  Alert and oriented x 3 PSYCHIATRIC:  Normal affect   ASSESSMENT:    No diagnosis found. PLAN:    In order of problems listed above:  1. Hypertension   2.  Syncope Remote history.  Based on history, likely vasovagal episodes.    Total time spent with patient today *** minutes. This includes reviewing records, evaluating the patient and coordinating care.   Medication Adjustments/Labs and Tests Ordered: Current medicines are reviewed at  length with the patient today.  Concerns regarding medicines are outlined above.  No orders of the defined types were placed in this encounter.  No orders of the defined types were placed in this encounter.    Signed, Steffanie Dunn, MD, [facility], [facility] 07/24/2020 7:48 AM    Electrophysiology [facility]

## 2020-12-23 ENCOUNTER — Encounter: Payer: Self-pay | Admitting: Family Medicine

## 2020-12-23 ENCOUNTER — Other Ambulatory Visit: Payer: Self-pay | Admitting: Family Medicine

## 2020-12-23 DIAGNOSIS — G4709 Other insomnia: Secondary | ICD-10-CM

## 2020-12-24 MED ORDER — ZOLPIDEM TARTRATE 10 MG PO TABS
ORAL_TABLET | ORAL | 1 refills | Status: DC
Start: 2020-12-24 — End: 2021-07-03

## 2021-02-22 ENCOUNTER — Encounter: Payer: Self-pay | Admitting: Family Medicine

## 2021-02-23 NOTE — Progress Notes (Addendum)
[facility] [address] [place], [place] [address] [phone]  Date:  02/27/2021   Name:  Brian Perkins   DOB:  07-Jan-1966   MRN:  [id number]  PCP:  Brian Mclean, MD    Chief Complaint: test anxiety (1.Pt had been fine for 20 years, on gun qualifying day he would have a lot of anxiety. Pt wonders if a short term med might be warranted. Also- should he continue the medication that he was prescribed when dealing with the kidney stone because maybe this was the cause. 2. Pt has to take Mucinex daily to prevent phlegm from building up in his chest. )   History of Present Illness:  Brian Perkins is a 55 y.o. 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 Then a month or so ago he had to do his annual Transport planner" a part of his job. 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  I ordered kit for him today Shingles vaccine COVID booster- done 2 weeks ago  Flu shot- done done 2 weeks   He has been seen by cardiology previously, most recently in April He was evaluated for presyncope, likely vasovagal They also noted difficulty controlling blood pressures He had an echocardiogram in March which was normal except for mild LVH Patient Active Problem List   Diagnosis Date Noted   Syncope and collapse 05/22/2020   Tachycardia 05/22/2020   Sepsis ([place]) 04/27/2016   Obesity 08/28/2014   Hypogonadism male 07/21/2013   Essential hypertension, benign 07/11/2011    Past Medical History:  Diagnosis Date   Allergy    Hypertension    Hypogonadism male     Past Surgical History:  Procedure Laterality Date   CHOLECYSTECTOMY      Social History   Tobacco Use   Smoking status: Former    Types: Cigarettes, Cigars    Quit date: 04/14/1989  Years since quitting: 31.8   Smokeless tobacco: Former   Tobacco comments:    not much of smoker  Substance Use Topics   Alcohol use: Yes    Comment: rarely 1 or 2 beers   Drug use: No    Family History  Problem Relation Age of Onset   Diabetes Mother    Hyperlipidemia Father    Hypertension Father     No Known Allergies  Medication list has been reviewed and updated.  Current Outpatient Medications on File Prior to Visit  Medication Sig Dispense Refill   acetaminophen (TYLENOL) 325 MG tablet Take 650 mg by mouth every 6 (six) hours as needed.     aspirin 81 MG tablet Take 81 mg by mouth daily.     Fish Oil OIL by Does not apply route daily.     Glucosamine HCl-MSM  (GLUCOSAMINE-MSM PO) Take 1,000 mg by mouth daily.     HYDROcodone-acetaminophen (NORCO/VICODIN) 5-325 MG tablet Take 1 tablet by mouth every 8 (eight) hours as needed. 10 tablet 0   Triamcinolone Acetonide (NASACORT AQ NA) Place into the nose.     zolpidem (AMBIEN) 10 MG tablet Take 1/2 to 1 at bedtime as needed for insomnia 30 tablet 1   fluticasone (FLONASE) 50 MCG/ACT nasal spray Place 2 sprays into the nose daily. 16 g 6   tamsulosin (FLOMAX) 0.4 MG CAPS capsule Take 0.4 mg by mouth daily. (Patient not taking: Reported on 02/27/2021)     No current facility-administered medications on file prior to visit.    Review of Systems:  As per HPI- otherwise negative.   Physical Examination: Vitals:   02/27/21 1521 02/27/21 1600  BP: (!) 158/88 (!) 144/80  Pulse: (!) 103   Resp: 18   Temp: 98.4 F (36.9 C)   SpO2: 96%    Vitals:   02/27/21 1521  Weight: 258 lb (117 kg)  Height: 6' (1.829 m)   Body mass index is 34.99 kg/m. Ideal Body Weight: Weight in (lb) to have BMI = 25: 183.9  GEN: no acute distress.  Obese, looks well HEENT: Atraumatic, Normocephalic.  Ears and Nose: No external deformity. CV: Noted multiple early beats/possible irregularity, No M/G/R. No JVD. No thrill. No extra heart sounds. PULM: CTA B, no wheezes, crackles, rhonchi. No retractions. No resp. distress. No accessory muscle use. ABD: S, NT, ND, +BS. No rebound. No HSM. EXTR: No c/c/e PSYCH: Normally interactive. Conversant.   EKG: Mild sinus tachycardia, rate 102.  Occasional ectopic beats. Otherwise compared with EKG February 2022 no significant changes noted Assessment and Plan: Essential hypertension, benign - Plan: CBC, Comprehensive metabolic panel  Screening for diabetes mellitus - Plan: Comprehensive metabolic panel, Hemoglobin A1c  Dyslipidemia - Plan: Lipid panel, rosuvastatin (CRESTOR) 10 MG tablet  Screening for colon cancer  Screening for prostate cancer - Plan: PSA  Accelerated  hypertension - Plan: amLODipine (NORVASC) 10 MG tablet, hydrochlorothiazide (HYDRODIURIL) 25 MG tablet, lisinopril (ZESTRIL) 40 MG tablet  Pulse irregularity - Plan: EKG 12-Lead  Situational anxiety - Plan: propranolol (INDERAL) 20 MG tablet, busPIRone (BUSPAR) 15 MG tablet  Colon cancer screening - Plan: Cologuard  Patient seen today for follow-up.  Routine labs are pending as above, order Cologuard  Jaheem is struggling with what sounds like performance anxiety, he is having a hard time passing his weapon/ shooting exam for the [employer]-he has done this many times in the past and never had a problem  We decided to start on BuSpar for background  anxiety, and I prescribed propranolol which he can use on an as-needed basis prior to performance  Start on BuSpar 7.5 twice daily, gradually titrate to 15 twice daily  They will let me know how he does  Will plan further follow- up pending labs.   Signed Lamar Blinks, MD  Received his labs 11/17 as below- message to pt  Results for orders placed or performed in visit on 02/27/21  CBC  Result Value Ref Range   WBC 9.0 4.0 - 10.5 K/uL   RBC 5.60 4.22 - 5.81 Mil/uL   Platelets 281.0 150.0 - 400.0 K/uL   Hemoglobin 15.9 13.0 - 17.0 g/dL   HCT 46.9 39.0 - 52.0 %   MCV 83.6 78.0 - 100.0 fl   MCHC 33.9 30.0 - 36.0 g/dL   RDW 13.5 11.5 - 15.5 %  Comprehensive metabolic panel  Result Value Ref Range   Sodium 139 135 - 145 mEq/L   Potassium 3.7 3.5 - 5.1 mEq/L   Chloride 102 96 - 112 mEq/L   CO2 30 19 - 32 mEq/L   Glucose, Bld 92 70 - 99 mg/dL   BUN 9 6 - 23 mg/dL   Creatinine, Ser 1.17 0.40 - 1.50 mg/dL   Total Bilirubin 1.7 (H) 0.2 - 1.2 mg/dL   Alkaline Phosphatase 84 39 - 117 U/L   AST 17 0 - 37 U/L   ALT 22 0 - 53 U/L   Total Protein 7.3 6.0 - 8.3 g/dL   Albumin 4.3 3.5 - 5.2 g/dL   GFR 70.03 >60.00 mL/min   Calcium 9.6 8.4 - 10.5 mg/dL  Hemoglobin A1c  Result Value Ref Range   Hgb A1c MFr Bld 5.5 4.6 - 6.5 %   Lipid panel  Result Value Ref Range   Cholesterol 193 0 - 200 mg/dL   Triglycerides 146.0 0.0 - 149.0 mg/dL   HDL 47.70 >39.00 mg/dL   VLDL 29.2 0.0 - 40.0 mg/dL   LDL Cholesterol 116 (H) 0 - 99 mg/dL   Total CHOL/HDL Ratio 4    NonHDL 145.12   PSA  Result Value Ref Range   PSA 1.02 0.10 - 4.00 ng/mL

## 2021-02-26 NOTE — Patient Instructions (Addendum)
It was good to see you again today! I am sorry you are having such a hard time Please start on the buspar at 7.5 mg twice a day and increase to 15 mg twice a day over the course of 10 days You can also use the propranolol as needed prior to a performance/ shooting test I will be in touch with your labs asap

## 2021-02-27 ENCOUNTER — Other Ambulatory Visit: Payer: Self-pay

## 2021-02-27 ENCOUNTER — Ambulatory Visit: Payer: BC Managed Care – PPO | Admitting: Family Medicine

## 2021-02-27 VITALS — BP 144/80 | HR 103 | Temp 98.4°F | Resp 18 | Ht 72.0 in | Wt 258.0 lb

## 2021-02-27 DIAGNOSIS — E785 Hyperlipidemia, unspecified: Secondary | ICD-10-CM

## 2021-02-27 DIAGNOSIS — Z1211 Encounter for screening for malignant neoplasm of colon: Secondary | ICD-10-CM | POA: Diagnosis not present

## 2021-02-27 DIAGNOSIS — Z125 Encounter for screening for malignant neoplasm of prostate: Secondary | ICD-10-CM

## 2021-02-27 DIAGNOSIS — R0989 Other specified symptoms and signs involving the circulatory and respiratory systems: Secondary | ICD-10-CM | POA: Diagnosis not present

## 2021-02-27 DIAGNOSIS — Z131 Encounter for screening for diabetes mellitus: Secondary | ICD-10-CM | POA: Diagnosis not present

## 2021-02-27 DIAGNOSIS — I10 Essential (primary) hypertension: Secondary | ICD-10-CM | POA: Diagnosis not present

## 2021-02-27 DIAGNOSIS — F418 Other specified anxiety disorders: Secondary | ICD-10-CM

## 2021-02-27 MED ORDER — ROSUVASTATIN CALCIUM 10 MG PO TABS: 10.0000 mg | ORAL_TABLET | Freq: Every day | ORAL | 3 refills | Status: DC

## 2021-02-27 MED ORDER — HYDROCHLOROTHIAZIDE 25 MG PO TABS: 25.0000 mg | ORAL_TABLET | Freq: Every day | ORAL | 3 refills | Status: DC

## 2021-02-27 MED ORDER — AMLODIPINE BESYLATE 10 MG PO TABS: 10.0000 mg | ORAL_TABLET | Freq: Every day | ORAL | 3 refills | Status: DC

## 2021-02-27 MED ORDER — PROPRANOLOL HCL 20 MG PO TABS: 20.0000 mg | ORAL_TABLET | Freq: Two times a day (BID) | ORAL | 1 refills | Status: DC

## 2021-02-27 MED ORDER — BUSPIRONE HCL 15 MG PO TABS: 15.0000 mg | ORAL_TABLET | Freq: Two times a day (BID) | ORAL | 3 refills | Status: DC

## 2021-02-27 MED ORDER — LISINOPRIL 40 MG PO TABS: 40.0000 mg | ORAL_TABLET | Freq: Every day | ORAL | 3 refills | Status: DC

## 2021-02-28 ENCOUNTER — Encounter: Payer: Self-pay | Admitting: Family Medicine

## 2021-02-28 LAB — COMPREHENSIVE METABOLIC PANEL
ALT: 22 U/L (ref 0–53)
AST: 17 U/L (ref 0–37)
Albumin: 4.3 g/dL (ref 3.5–5.2)
Alkaline Phosphatase: 84 U/L (ref 39–117)
BUN: 9 mg/dL (ref 6–23)
CO2: 30 mEq/L (ref 19–32)
Calcium: 9.6 mg/dL (ref 8.4–10.5)
Chloride: 102 mEq/L (ref 96–112)
Creatinine, Ser: 1.17 mg/dL (ref 0.40–1.50)
GFR: 70.03 mL/min (ref >60.00)
Glucose, Bld: 92 mg/dL (ref 70–99)
Potassium: 3.7 mEq/L (ref 3.5–5.1)
Sodium: 139 mEq/L (ref 135–145)
Total Bilirubin: 1.7 mg/dL — ABNORMAL HIGH (ref 0.2–1.2)
Total Protein: 7.3 g/dL (ref 6.0–8.3)

## 2021-02-28 LAB — LIPID PANEL
Cholesterol: 193 mg/dL (ref 0–200)
HDL: 47.7 mg/dL (ref >39.00)
LDL Cholesterol: 116 mg/dL — ABNORMAL HIGH (ref 0–99)
NonHDL: 145.12
Total CHOL/HDL Ratio: 4
Triglycerides: 146 mg/dL (ref 0.0–149.0)
VLDL: 29.2 mg/dL (ref 0.0–40.0)

## 2021-02-28 LAB — CBC
HCT: 46.9 % (ref 39.0–52.0)
Hemoglobin: 15.9 g/dL (ref 13.0–17.0)
MCHC: 33.9 g/dL (ref 30.0–36.0)
MCV: 83.6 fl (ref 78.0–100.0)
Platelets: 281 10*3/uL (ref 150.0–400.0)
RBC: 5.6 Mil/uL (ref 4.22–5.81)
RDW: 13.5 % (ref 11.5–15.5)
WBC: 9 10*3/uL (ref 4.0–10.5)

## 2021-02-28 LAB — PSA: PSA: 1.02 ng/mL (ref 0.10–4.00)

## 2021-02-28 LAB — HEMOGLOBIN A1C: Hgb A1c MFr Bld: 5.5 % (ref 4.6–6.5)

## 2021-02-28 NOTE — Addendum Note (Signed)
Addended by: Abbe Amsterdam C on: 02/28/2021 06:11 PM   Modules accepted: Orders

## 2021-03-09 ENCOUNTER — Other Ambulatory Visit: Payer: Self-pay | Admitting: Family Medicine

## 2021-03-09 DIAGNOSIS — F418 Other specified anxiety disorders: Secondary | ICD-10-CM

## 2021-03-24 ENCOUNTER — Other Ambulatory Visit: Payer: Self-pay | Admitting: Family Medicine

## 2021-03-24 DIAGNOSIS — F418 Other specified anxiety disorders: Secondary | ICD-10-CM

## 2021-04-04 ENCOUNTER — Other Ambulatory Visit: Payer: Self-pay | Admitting: Family Medicine

## 2021-04-04 DIAGNOSIS — F418 Other specified anxiety disorders: Secondary | ICD-10-CM

## 2021-04-18 ENCOUNTER — Other Ambulatory Visit: Payer: Self-pay | Admitting: Family Medicine

## 2021-04-18 DIAGNOSIS — F418 Other specified anxiety disorders: Secondary | ICD-10-CM

## 2021-06-22 ENCOUNTER — Other Ambulatory Visit: Payer: Self-pay | Admitting: Family Medicine

## 2021-06-22 DIAGNOSIS — E785 Hyperlipidemia, unspecified: Secondary | ICD-10-CM

## 2021-07-02 ENCOUNTER — Encounter: Payer: Self-pay | Admitting: Family Medicine

## 2021-07-02 DIAGNOSIS — E785 Hyperlipidemia, unspecified: Secondary | ICD-10-CM

## 2021-07-02 DIAGNOSIS — I10 Essential (primary) hypertension: Secondary | ICD-10-CM

## 2021-07-02 DIAGNOSIS — G4709 Other insomnia: Secondary | ICD-10-CM

## 2021-07-02 DIAGNOSIS — F418 Other specified anxiety disorders: Secondary | ICD-10-CM

## 2021-07-02 MED ORDER — HYDROCHLOROTHIAZIDE 25 MG PO TABS: 25.0000 mg | ORAL_TABLET | Freq: Every day | ORAL | 3 refills | Status: DC

## 2021-07-02 MED ORDER — ROSUVASTATIN CALCIUM 10 MG PO TABS: 10.0000 mg | ORAL_TABLET | Freq: Every day | ORAL | 1 refills | Status: DC

## 2021-07-02 MED ORDER — AMLODIPINE BESYLATE 10 MG PO TABS: 10.0000 mg | ORAL_TABLET | Freq: Every day | ORAL | 3 refills | Status: DC

## 2021-07-02 MED ORDER — LISINOPRIL 40 MG PO TABS: 40.0000 mg | ORAL_TABLET | Freq: Every day | ORAL | 3 refills | Status: DC

## 2021-07-02 MED ORDER — BUSPIRONE HCL 15 MG PO TABS: 15.0000 mg | ORAL_TABLET | Freq: Two times a day (BID) | ORAL | 2 refills | Status: DC

## 2021-07-03 MED ORDER — ZOLPIDEM TARTRATE 10 MG PO TABS: 5.0000 mg | ORAL_TABLET | Freq: Every evening | ORAL | 1 refills | Status: DC | PRN

## 2021-07-03 NOTE — Addendum Note (Signed)
Addended by: Pearline Cables on: 07/03/2021 08:23 AM ? ? Modules accepted: Orders ? ?

## 2021-07-03 NOTE — Addendum Note (Signed)
Addended byConrad [place] D on: 07/03/2021 08:12 AM ? ? Modules accepted: Orders ? ?

## 2021-11-07 ENCOUNTER — Encounter: Payer: Self-pay | Admitting: Family Medicine

## 2021-11-07 DIAGNOSIS — G4709 Other insomnia: Secondary | ICD-10-CM

## 2021-11-07 MED ORDER — ZOLPIDEM TARTRATE 10 MG PO TABS: 5.0000 mg | ORAL_TABLET | Freq: Every evening | ORAL | 1 refills | Status: DC | PRN

## 2021-11-13 ENCOUNTER — Telehealth: Payer: BC Managed Care – PPO | Admitting: Physician Assistant

## 2021-11-13 DIAGNOSIS — J019 Acute sinusitis, unspecified: Secondary | ICD-10-CM

## 2021-11-13 DIAGNOSIS — B9689 Other specified bacterial agents as the cause of diseases classified elsewhere: Secondary | ICD-10-CM

## 2021-11-13 MED ORDER — AMOXICILLIN-POT CLAVULANATE 875-125 MG PO TABS: 1.0000 | ORAL_TABLET | Freq: Two times a day (BID) | ORAL | 0 refills | Status: DC

## 2021-11-13 MED ORDER — BENZONATATE 100 MG PO CAPS: 100.0000 mg | ORAL_CAPSULE | Freq: Three times a day (TID) | ORAL | 0 refills | Status: AC | PRN

## 2021-11-13 NOTE — Patient Instructions (Signed)
Brian Perkins, thank you for joining [name], PA-C for today's virtual visit.  While this provider is not your primary care provider (PCP), if your PCP is located in our provider database this encounter information will be shared with them immediately following your visit.  Consent: (Patient) Brian Perkins provided verbal consent for this virtual visit at the beginning of the encounter.  Current Medications:  Current Outpatient Medications:    acetaminophen (TYLENOL) 325 MG tablet, Take 650 mg by mouth every 6 (six) hours as needed., Disp: , Rfl:    amLODipine (NORVASC) 10 MG tablet, Take 1 tablet (10 mg total) by mouth daily., Disp: 90 tablet, Rfl: 3   aspirin 81 MG tablet, Take 81 mg by mouth daily., Disp: , Rfl:    busPIRone (BUSPAR) 15 MG tablet, Take 1 tablet (15 mg total) by mouth 2 (two) times daily. Start with a 1/2 tablet twice a day, Disp: 180 tablet, Rfl: 2   Fish Oil OIL, by Does not apply route daily., Disp: , Rfl:    fluticasone (FLONASE) 50 MCG/ACT nasal spray, Place 2 sprays into the nose daily., Disp: 16 g, Rfl: 6   Glucosamine HCl-MSM (GLUCOSAMINE-MSM PO), Take 1,000 mg by mouth daily., Disp: , Rfl:    hydrochlorothiazide (HYDRODIURIL) 25 MG tablet, Take 1 tablet (25 mg total) by mouth daily., Disp: 90 tablet, Rfl: 3   HYDROcodone-acetaminophen (NORCO/VICODIN) 5-325 MG tablet, Take 1 tablet by mouth every 8 (eight) hours as needed., Disp: 10 tablet, Rfl: 0   lisinopril (ZESTRIL) 40 MG tablet, Take 1 tablet (40 mg total) by mouth daily., Disp: 90 tablet, Rfl: 3   propranolol (INDERAL) 20 MG tablet, TAKE 1 TABLET (20 MG TOTAL) BY MOUTH 2 (TWO) TIMES DAILY. USE AS NEEDED FOR PERFORMANCE ANXIETY, Disp: 180 tablet, Rfl: 0   rosuvastatin (CRESTOR) 10 MG tablet, Take 1 tablet (10 mg total) by mouth daily., Disp: 90 tablet, Rfl: 1   tamsulosin (FLOMAX) 0.4 MG CAPS capsule, Take 0.4 mg by mouth daily. (Patient not taking: Reported on 02/27/2021), Disp: , Rfl:     Triamcinolone Acetonide (NASACORT AQ NA), Place into the nose., Disp: , Rfl:    zolpidem (AMBIEN) 10 MG tablet, Take 0.5-1 tablets (5-10 mg total) by mouth at bedtime as needed for sleep., Disp: 30 tablet, Rfl: 1   Medications ordered in this encounter:  No orders of the defined types were placed in this encounter.    *If you need refills on other medications prior to your next appointment, please contact your pharmacy*  Follow-Up: Call back or seek an in-person evaluation if the symptoms worsen or if the condition fails to improve as anticipated.  Other Instructions Please take antibiotic as directed.  Increase fluid intake.  Use Saline nasal spray.  Take a daily multivitamin. Use Mucinex OTC to help thin the congestion. The Tessalon will also help with cough.  Place a humidifier in the bedroom.  Please call or return clinic if symptoms are not improving.  Sinusitis Sinusitis is redness, soreness, and swelling (inflammation) of the paranasal sinuses. Paranasal sinuses are air pockets within the bones of your face (beneath the eyes, the middle of the forehead, or above the eyes). In healthy paranasal sinuses, mucus is able to drain out, and air is able to circulate through them by way of your nose. However, when your paranasal sinuses are inflamed, mucus and air can become trapped. This can allow bacteria and other germs to grow and cause infection. Sinusitis can develop  quickly and last only a short time (acute) or continue over a long period (chronic). Sinusitis that lasts for more than 12 weeks is considered chronic.  CAUSES  Causes of sinusitis include: Allergies. Structural abnormalities, such as displacement of the cartilage that separates your nostrils (deviated septum), which can decrease the air flow through your nose and sinuses and affect sinus drainage. Functional abnormalities, such as when the small hairs (cilia) that line your sinuses and help remove mucus do not work properly or  are not present. SYMPTOMS  Symptoms of acute and chronic sinusitis are the same. The primary symptoms are pain and pressure around the affected sinuses. Other symptoms include: Upper toothache. Earache. Headache. Bad breath. Decreased sense of smell and taste. A cough, which worsens when you are lying flat. Fatigue. Fever. Thick drainage from your nose, which often is green and may contain pus (purulent). Swelling and warmth over the affected sinuses. DIAGNOSIS  Your caregiver will perform a physical exam. During the exam, your caregiver may: Look in your nose for signs of abnormal growths in your nostrils (nasal polyps). Tap over the affected sinus to check for signs of infection. View the inside of your sinuses (endoscopy) with a special imaging device with a light attached (endoscope), which is inserted into your sinuses. If your caregiver suspects that you have chronic sinusitis, one or more of the following tests may be recommended: Allergy tests. Nasal culture A sample of mucus is taken from your nose and sent to a lab and screened for bacteria. Nasal cytology A sample of mucus is taken from your nose and examined by your caregiver to determine if your sinusitis is related to an allergy. TREATMENT  Most cases of acute sinusitis are related to a viral infection and will resolve on their own within 10 days. Sometimes medicines are prescribed to help relieve symptoms (pain medicine, decongestants, nasal steroid sprays, or saline sprays).  However, for sinusitis related to a bacterial infection, your caregiver will prescribe antibiotic medicines. These are medicines that will help kill the bacteria causing the infection.  Rarely, sinusitis is caused by a fungal infection. In theses cases, your caregiver will prescribe antifungal medicine. For some cases of chronic sinusitis, surgery is needed. Generally, these are cases in which sinusitis recurs more than 3 times per year, despite other  treatments. HOME CARE INSTRUCTIONS  Drink plenty of water. Water helps thin the mucus so your sinuses can drain more easily. Use a humidifier. Inhale steam 3 to 4 times a day (for example, sit in the bathroom with the shower running). Apply a warm, moist washcloth to your face 3 to 4 times a day, or as directed by your caregiver. Use saline nasal sprays to help moisten and clean your sinuses. Take over-the-counter or prescription medicines for pain, discomfort, or fever only as directed by your caregiver. SEEK IMMEDIATE MEDICAL CARE IF: You have increasing pain or severe headaches. You have nausea, vomiting, or drowsiness. You have swelling around your face. You have vision problems. You have a stiff neck. You have difficulty breathing. MAKE SURE YOU:  Understand these instructions. Will watch your condition. Will get help right away if you are not doing well or get worse. Document Released: 03/31/2005 Document Revised: 06/23/2011 Document Reviewed: 04/15/2011 [facility] Patient Information 2014 [place], [place].    If you have been instructed to have an in-person evaluation today at a local Urgent Care facility, please use the link below. It will take you to a list of all of our  available [facility], including address, phone number and hours of operation. Please do not delay care.  [facility]  If you or a family member do not have a primary care provider, use the link below to schedule a visit and establish care. When you choose a [facility] primary care physician or advanced practice provider, you gain a long-term partner in health. Find a Primary Care Provider  Learn more about [facility] in-office and virtual care options: [facility]

## 2021-11-13 NOTE — Progress Notes (Signed)
Virtual Visit Consent   Brian Perkins, you are scheduled for a virtual visit with a [facility] provider today. Just as with appointments in the office, your consent must be obtained to participate. Your consent will be active for this visit and any virtual visit you may have with one of our providers in the next 365 days. If you have a MyChart account, a copy of this consent can be sent to you electronically.  As this is a virtual visit, video technology does not allow for your provider to perform a traditional examination. This may limit your provider's ability to fully assess your condition. If your provider identifies any concerns that need to be evaluated in person or the need to arrange testing (such as labs, EKG, etc.), we will make arrangements to do so. Although advances in technology are sophisticated, we cannot ensure that it will always work on either your end or our end. If the connection with a video visit is poor, the visit may have to be switched to a telephone visit. With either a video or telephone visit, we are not always able to ensure that we have a secure connection.  By engaging in this virtual visit, you consent to the provision of healthcare and authorize for your insurance to be billed (if applicable) for the services provided during this visit. Depending on your insurance coverage, you may receive a charge related to this service.  I need to obtain your verbal consent now. Are you willing to proceed with your visit today? PAYDON CARLL has provided verbal consent on 11/13/2021 for a virtual visit (video or telephone). Brian Perkins, [place]  Date: 11/13/2021 6:54 PM  Virtual Visit via Video Note   I, Brian Perkins, connected with  ELIC VENCILL  ([id number], 25-Sep-1965) on 11/13/21 at  7:00 PM EDT by a video-enabled telemedicine application and verified that I am speaking with the correct person using two identifiers.  Location: Patient: Virtual Visit Location  Patient: Home Provider: Virtual Visit Location Provider: Home Office   I discussed the limitations of evaluation and management by telemedicine and the availability of in person appointments. The patient expressed understanding and agreed to proceed.    History of Present Illness: Brian Perkins is a 56 y.o. who identifies as a male who was assigned male at birth, and is being seen today for possible sinusitis. Notes symptoms starting about a week or so ago with nasal congestion and PND with mild cough, coughing up his drainage. This has progressed since onset with more frequent coughing, increased congestion, now with sinus pressure and sinus pain. Nasla drainage is thick and drainage now.  HPI: HPI  Problems:  Patient Active Problem List   Diagnosis Date Noted   Syncope and collapse 05/22/2020   Tachycardia 05/22/2020   Sepsis (HCC) 04/27/2016   Obesity 08/28/2014   Hypogonadism male 07/21/2013   Essential hypertension, benign 07/11/2011    Allergies: No Known Allergies Medications:  Current Outpatient Medications:    amoxicillin-clavulanate (AUGMENTIN) 875-125 MG tablet, Take 1 tablet by mouth 2 (two) times daily., Disp: 14 tablet, Rfl: 0   benzonatate (TESSALON) 100 MG capsule, Take 1 capsule (100 mg total) by mouth 3 (three) times daily as needed for cough., Disp: 30 capsule, Rfl: 0   acetaminophen (TYLENOL) 325 MG tablet, Take 650 mg by mouth every 6 (six) hours as needed., Disp: , Rfl:    amLODipine (NORVASC) 10 MG tablet, Take 1 tablet (10 mg total) by mouth  daily., Disp: 90 tablet, Rfl: 3   aspirin 81 MG tablet, Take 81 mg by mouth daily., Disp: , Rfl:    busPIRone (BUSPAR) 15 MG tablet, Take 1 tablet (15 mg total) by mouth 2 (two) times daily. Start with a 1/2 tablet twice a day, Disp: 180 tablet, Rfl: 2   Fish Oil OIL, by Does not apply route daily., Disp: , Rfl:    fluticasone (FLONASE) 50 MCG/ACT nasal spray, Place 2 sprays into the nose daily., Disp: 16 g, Rfl: 6    Glucosamine HCl-MSM (GLUCOSAMINE-MSM PO), Take 1,000 mg by mouth daily., Disp: , Rfl:    hydrochlorothiazide (HYDRODIURIL) 25 MG tablet, Take 1 tablet (25 mg total) by mouth daily., Disp: 90 tablet, Rfl: 3   HYDROcodone-acetaminophen (NORCO/VICODIN) 5-325 MG tablet, Take 1 tablet by mouth every 8 (eight) hours as needed., Disp: 10 tablet, Rfl: 0   lisinopril (ZESTRIL) 40 MG tablet, Take 1 tablet (40 mg total) by mouth daily., Disp: 90 tablet, Rfl: 3   propranolol (INDERAL) 20 MG tablet, TAKE 1 TABLET (20 MG TOTAL) BY MOUTH 2 (TWO) TIMES DAILY. USE AS NEEDED FOR PERFORMANCE ANXIETY, Disp: 180 tablet, Rfl: 0   rosuvastatin (CRESTOR) 10 MG tablet, Take 1 tablet (10 mg total) by mouth daily., Disp: 90 tablet, Rfl: 1   tamsulosin (FLOMAX) 0.4 MG CAPS capsule, Take 0.4 mg by mouth daily. (Patient not taking: Reported on 02/27/2021), Disp: , Rfl:    Triamcinolone Acetonide (NASACORT AQ NA), Place into the nose., Disp: , Rfl:    zolpidem (AMBIEN) 10 MG tablet, Take 0.5-1 tablets (5-10 mg total) by mouth at bedtime as needed for sleep., Disp: 30 tablet, Rfl: 1  Observations/Objective: Patient is well-developed, well-nourished in no acute distress.  Resting comfortably at home.  Head is normocephalic, atraumatic.  No labored breathing. Speech is clear and coherent with logical content.  Patient is alert and oriented at baseline. .   Assessment and Plan: 1. Acute bacterial sinusitis - benzonatate (TESSALON) 100 MG capsule; Take 1 capsule (100 mg total) by mouth 3 (three) times daily as needed for cough.  Dispense: 30 capsule; Refill: 0 - amoxicillin-clavulanate (AUGMENTIN) 875-125 MG tablet; Take 1 tablet by mouth 2 (two) times daily.  Dispense: 14 tablet; Refill: 0  Rx Augmentin.  Increase fluids.  Rest.  Saline nasal spray.  Probiotic.  Mucinex as directed.  Humidifier in bedroom. Tessalon per orders.   Call or return to clinic if symptoms are not improving.   Follow Up Instructions: I discussed  the assessment and treatment plan with the patient. The patient was provided an opportunity to ask questions and all were answered. The patient agreed with the plan and demonstrated an understanding of the instructions.  A copy of instructions were sent to the patient via MyChart unless otherwise noted below.   The patient was advised to call back or seek an in-person evaluation if the symptoms worsen or if the condition fails to improve as anticipated.  Time:  I spent 10 minutes with the patient via telehealth technology discussing the above problems/concerns.    Brian Climes, PA-C

## 2021-11-21 ENCOUNTER — Encounter: Payer: Self-pay | Admitting: Family Medicine

## 2021-11-21 NOTE — Telephone Encounter (Signed)
Pt was prescribed Augmentin 875-125 mid- last week. Please advise.

## 2022-05-08 ENCOUNTER — Encounter: Payer: Self-pay | Admitting: Family Medicine

## 2022-05-08 ENCOUNTER — Other Ambulatory Visit: Payer: Self-pay | Admitting: Family Medicine

## 2022-05-08 DIAGNOSIS — G4709 Other insomnia: Secondary | ICD-10-CM

## 2022-06-04 ENCOUNTER — Other Ambulatory Visit: Payer: Self-pay | Admitting: Family Medicine

## 2022-06-04 DIAGNOSIS — G4709 Other insomnia: Secondary | ICD-10-CM

## 2022-06-04 MED ORDER — ZOLPIDEM TARTRATE 10 MG PO TABS: 5.0000 mg | ORAL_TABLET | Freq: Every evening | ORAL | 0 refills | Status: DC | PRN

## 2022-06-10 NOTE — Progress Notes (Deleted)
[facility] at [facility] [address] [place], [place] [address] [id number] [phone]  Date:  06/18/2022   Name:  Brian Perkins   DOB:  07/19/1965   MRN:  [id number]  PCP:  Brian Mclean, MD    Chief Complaint: No chief complaint on file.   History of Present Illness:  Brian Perkins is a 57 y.o. 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   Tdap Colon cancer screening Shingrix Flu shot Covid booster recommended  Labs can be updated   Patient Active Problem List   Diagnosis Date Noted   Syncope and collapse 05/22/2020   Tachycardia 05/22/2020   Sepsis ([place]) 04/27/2016   Obesity 08/28/2014   Hypogonadism male 07/21/2013   Essential hypertension, benign 07/11/2011    Past Medical History:  Diagnosis Date   Allergy    Hypertension    Hypogonadism male     Past Surgical History:  Procedure Laterality Date   CHOLECYSTECTOMY      Social History   Tobacco Use   Smoking status: Former    Types: Cigarettes, Cigars    Quit date: 04/14/1989    Years since quitting: 33.1   Smokeless tobacco: Former   Tobacco comments:    not much of smoker  Substance Use Topics   Alcohol use: Yes    Comment: rarely 1 or 2 beers   Drug use: No    Family History  Problem Relation Age of Onset   Diabetes Mother    Hyperlipidemia Father    Hypertension Father     No Known Allergies  Medication list has been reviewed and updated.  Current Outpatient Medications on File Prior to Visit  Medication Sig Dispense Refill   acetaminophen (TYLENOL) 325 MG tablet Take 650 mg by mouth every 6 (six) hours as needed.     amLODipine (NORVASC) 10 MG tablet Take 1 tablet  (10 mg total) by mouth daily. 90 tablet 3   aspirin 81 MG tablet Take 81 mg by mouth daily.     benzonatate (TESSALON) 100 MG capsule Take 1 capsule (100 mg total) by mouth 3 (three) times daily as needed for cough. 30 capsule 0   busPIRone (BUSPAR) 15 MG tablet Take 1 tablet (15 mg total) by mouth 2 (two) times daily. Start with a 1/2 tablet twice a day 180 tablet 2   Fish Oil OIL by Does not apply route daily.     fluticasone (FLONASE) 50 MCG/ACT nasal spray Place 2 sprays into the nose daily. 16 g 6   Glucosamine HCl-MSM (GLUCOSAMINE-MSM PO) Take 1,000 mg by mouth daily.     hydrochlorothiazide (HYDRODIURIL) 25 MG tablet Take 1 tablet (25 mg total) by mouth daily. 90 tablet 3   HYDROcodone-acetaminophen (NORCO/VICODIN) 5-325 MG tablet Take 1 tablet by mouth every 8 (eight) hours as needed. 10 tablet 0   lisinopril (ZESTRIL) 40 MG tablet Take 1 tablet (40 mg total) by mouth daily. 90 tablet 3   propranolol (INDERAL) 20 MG tablet TAKE 1 TABLET (20 MG TOTAL) BY MOUTH 2 (TWO) TIMES DAILY. USE AS NEEDED FOR PERFORMANCE ANXIETY 180 tablet 0   rosuvastatin (CRESTOR) 10 MG tablet Take 1  tablet (10 mg total) by mouth daily. 90 tablet 1   tamsulosin (FLOMAX) 0.4 MG CAPS capsule Take 0.4 mg by mouth daily. (Patient not taking: Reported on 02/27/2021)     Triamcinolone Acetonide (NASACORT AQ NA) Place into the nose.     zolpidem (AMBIEN) 10 MG tablet Take 0.5-1 tablets (5-10 mg total) by mouth at bedtime as needed for sleep. 8 tablet 0   No current facility-administered medications on file prior to visit.    Review of Systems:  As per HPI- otherwise negative.   Physical Examination: There were no vitals filed for this visit. There were no vitals filed for this visit. There is no height or weight on file to calculate BMI. Ideal Body Weight:    GEN: no acute distress. HEENT: Atraumatic, Normocephalic.  Ears and Nose: No external deformity. CV: RRR, No M/G/R. No JVD. No thrill. No extra heart  sounds. PULM: CTA B, no wheezes, crackles, rhonchi. No retractions. No resp. distress. No accessory muscle use. ABD: S, NT, ND, +BS. No rebound. No HSM. EXTR: No c/c/e PSYCH: Normally interactive. Conversant.    Assessment and Plan: *** Physical exam- encouraged healthy diet and exercise routine Will plan further follow- up pending labs.  Signed Lamar Blinks, MD

## 2022-06-18 ENCOUNTER — Encounter: Payer: BC Managed Care – PPO | Admitting: Family Medicine

## 2022-06-18 DIAGNOSIS — F418 Other specified anxiety disorders: Secondary | ICD-10-CM

## 2022-06-18 DIAGNOSIS — Z1211 Encounter for screening for malignant neoplasm of colon: Secondary | ICD-10-CM

## 2022-06-18 DIAGNOSIS — G4709 Other insomnia: Secondary | ICD-10-CM

## 2022-06-18 DIAGNOSIS — E785 Hyperlipidemia, unspecified: Secondary | ICD-10-CM

## 2022-06-18 DIAGNOSIS — Z125 Encounter for screening for malignant neoplasm of prostate: Secondary | ICD-10-CM

## 2022-06-18 DIAGNOSIS — Z131 Encounter for screening for diabetes mellitus: Secondary | ICD-10-CM

## 2022-06-18 DIAGNOSIS — I10 Essential (primary) hypertension: Secondary | ICD-10-CM

## 2022-06-25 NOTE — Patient Instructions (Addendum)
It was good to see again today, I will be in touch with your lab work  COVID booster if not done in the last 6 months or so  We will send you a cologuard kit We will test you for chicken pox to make sure you need the shingles vaccine I will reach out to your cardiologist as well   Please be sure to take your BP meds on a regular basis and check your home BP 1-2x a day; let me know how it looks. I can add a BP medication if needed

## 2022-06-25 NOTE — Progress Notes (Signed)
Brian Perkins at [facility], [place], [place] [address] [phone]  Date:  06/30/2022   Name:  Brian Perkins   DOB:  June 03, 1965   MRN:  [id number]  PCP:  Brian Mclean, MD    Chief Complaint: Annual Exam (Concerns/ questions: none/Colon: none recently/Tdap; UTD- about 5 years ago/Flu- [facility] [address] Brian Perkins: nonw)   History of Present Illness:  Brian Perkins is a 57 y.o. 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  He used a low-dose of propranolol as needed to suppress tremor and anxiety which was effective  He has seen cardiology in the past for likely vasovagal presyncope Echocardiogram March 2022-echo revealed mild LVH and careful blood pressure control/annual follow-up was recommended by Dr. Quentin Perkins.  However Brian Perkins is overdue for follow-up  I ordered Cologuard at last visit but not completed.  Went over colon cancer screening options again, the patient would like to do Cologuard.  I ordered another kit  tetanus- pt reports this is UTD, about 5 years ago  Shingrix-recommended, however he is not certain if he ever had the chickenpox.  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  And the medication for the gun qualifying went quite well with your suggestion.  Thank you so very much for all that you do.      Hey Dr. Lorelei Perkins.  This is Brian Perkins's wife, Brian Perkins.  I hope you are well.  I have been extremely unwell myself and I m sure it has strained and stressed Brian Perkins , though he hides it well.    Please remind him of the importance of taking his medication and also to get proper sleep.  He works a lot.  Sometimes 7-14 days without a day off.  I told him his mind and body need to rest some and not to overdo it.  He does sometimes have trouble falling asleep or staying asleep- especially if he doesn't take his [place].  His mind just thinks about work work work.     Also I've noticed an increase in number of trips to restroom to urinate.  I know this isn't uncommon, but was concerned that his prostate may be enlarged and I didn't want it to cause him any problems.  He also sometimes has long term issues with mucus and has to take Mucinex when that happens.   It can go on for 6 weeks and then clear up for a while and come back.     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  I don't want it to cause issues with his job, but do you have some suggestions such as a hearing aid or something.  It has gone m on for over a year and gotten worse.  He may be stubborn to discuss these issues but I think with your reassurance that it could be very helpful for him and make his life easier, he may be more inclined to listen because he really values your opinion and treatments!!!   Patient Active Problem List   Diagnosis Date Noted   Syncope  and collapse 05/22/2020   Tachycardia 05/22/2020   Sepsis ([place]) 04/27/2016   Obesity 08/28/2014   Hypogonadism male 07/21/2013   Essential hypertension, benign 07/11/2011    Past Medical History:  Diagnosis Date   Allergy    Hypertension    Hypogonadism male     Past Surgical History:  Procedure Laterality Date   CHOLECYSTECTOMY      Social History   Tobacco Use   Smoking status: Former    Types: Cigarettes, Cigars    Quit date: 04/14/1989    Years since quitting: 33.2   Smokeless tobacco: Former   Tobacco comments:    not much of smoker  Substance Use Topics   Alcohol use: Yes    Comment: rarely 1 or 2 beers   Drug use: No    Family History  Problem Relation Age of Onset   Diabetes Mother    Hyperlipidemia Father    Hypertension Father     No Known Allergies  Medication list has been reviewed and updated.  Current Outpatient Medications on File Prior to Visit  Medication Sig Dispense Refill   acetaminophen (TYLENOL) 325 MG tablet Take 650 mg by mouth every 6 (six) hours as needed.     aspirin 81 MG tablet Take 81 mg by mouth daily.     benzonatate (TESSALON) 100 MG capsule Take 1 capsule (100 mg total) by mouth 3 (three) times daily as needed for cough. 30 capsule 0   Fish Oil OIL by Does not apply route daily.     Glucosamine HCl-MSM (GLUCOSAMINE-MSM PO) Take 1,000 mg by mouth daily.     HYDROcodone-acetaminophen (NORCO/VICODIN) 5-325 MG tablet Take 1 tablet by mouth every 8 (eight) hours as needed. 10 tablet 0   propranolol (INDERAL) 20 MG tablet TAKE 1 TABLET (20 MG TOTAL) BY MOUTH 2 (TWO) TIMES DAILY. USE AS NEEDED FOR PERFORMANCE ANXIETY 180 tablet 0   tamsulosin (FLOMAX) 0.4 MG CAPS capsule Take 0.4 mg by mouth daily.     Triamcinolone Acetonide (NASACORT AQ NA) Place into the nose.     zolpidem (AMBIEN) 10 MG tablet Take 0.5-1 tablets (5-10 mg total) by mouth at bedtime as needed for sleep. 8 tablet 0   fluticasone (FLONASE) 50 MCG/ACT nasal spray  Place 2 sprays into the nose daily. 16 g 6   No current facility-administered medications on file prior to visit.    Review of Systems:  As per HPI- otherwise negative.   Physical Examination: Vitals:   06/30/22 1258  BP: (!) 162/82  Pulse: 86  Resp: 18  Temp: 97.9 F (36.6 C)  SpO2: 96%   Vitals:  06/30/22 1258  Weight: 251 lb 12.8 oz (114.2 kg)  Height: 6' (1.829 m)   Body mass index is 34.15 kg/m. Ideal Body Weight: Weight in (lb) to have BMI = 25: 183.9  GEN: no acute distress.  Obese, otherwise looks well HEENT: Atraumatic, Normocephalic.  Bilateral TM wnl, oropharynx normal.  PEERL,EOMI.   No significant cerumen in ear canals Ears and Nose: No external deformity. CV: Noted some irregularity of pulse, will obtain EKG, No M/G/R. No JVD. No thrill. No extra heart sounds. PULM: CTA B, no wheezes, crackles, rhonchi. No retractions. No resp. distress. No accessory muscle use. ABD: S, NT, ND, +BS. No rebound. No HSM. EXTR: No c/c/e PSYCH: Normally interactive. Conversant.   EKG: Sinus rhythm with occasional ectopic ventricular beats.  RSR prime V1, nonspecific.  Evidence of atrial enlargement Compared with EKG from 11/22 no significant changes noted Assessment and Plan: Physical exam  Essential hypertension, benign - Plan: CBC, Comprehensive metabolic panel  Screening for prostate cancer - Plan: PSA  Situational anxiety - Plan: TSH, busPIRone (BUSPAR) 15 MG tablet  Dyslipidemia - Plan: Lipid panel, rosuvastatin (CRESTOR) 10 MG tablet  Screening for colon cancer - Plan: Cologuard  Screening for diabetes mellitus - Plan: Comprehensive metabolic panel, Hemoglobin A1c  Accelerated hypertension - Plan: amLODipine (NORVASC) 10 MG tablet, hydrochlorothiazide (HYDRODIURIL) 25 MG tablet, lisinopril (ZESTRIL) 40 MG tablet  Irregularly irregular pulse rhythm - Plan: EKG 12-Lead  Exposure to varicella - Plan: Varicella zoster antibody, IgG   Physical exam today.   Encouraged healthy diet and exercise routine Will plan further follow- up pending labs.  Patient is overdue for cardiology follow-up, I sent a message to Dr. Quentin Perkins and asked them to please reach out to the patient and get him scheduled  I encouraged Brian Perkins to work on his blood pressure in earnest.  Explained that I am concerned about his chronic, incompletely controlled hypertension which puts him at risk for heart disease, stroke, renal failure.  Will not change medication today as he has not necessarily been taking his current medications on a regular basis.  He agrees to be more consistent and will obtain some blood pressure readings at home-he will update me in about a week  He denies any chest pain or shortness of breath except he may get short of breath when chasing someone in his work as a Engineer, structural    Signed Lamar Blinks, MD  Obtain labs as below, message to patient  Results for orders placed or performed in visit on 06/30/22  CBC  Result Value Ref Range   WBC 7.8 4.0 - 10.5 K/uL   RBC 5.52 4.22 - 5.81 Mil/uL   Platelets 231.0 150.0 - 400.0 K/uL   Hemoglobin 15.9 13.0 - 17.0 g/dL   HCT 45.6 39.0 - 52.0 %   MCV 82.7 78.0 - 100.0 fl   MCHC 34.9 30.0 - 36.0 g/dL   RDW 13.9 11.5 - 15.5 %  Comprehensive metabolic panel  Result Value Ref Range   Sodium 139 135 - 145 mEq/L   Potassium 3.9 3.5 - 5.1 mEq/L   Chloride 102 96 - 112 mEq/L   CO2 28 19 - 32 mEq/L   Glucose, Bld 102 (H) 70 - 99 mg/dL   BUN 13 6 - 23 mg/dL   Creatinine, Ser 1.03 0.40 - 1.50 mg/dL   Total Bilirubin 1.2 0.2 - 1.2 mg/dL   Alkaline Phosphatase 84 39 - 117 U/L   AST 16 0 - 37 U/L  ALT 17 0 - 53 U/L   Total Protein 6.9 6.0 - 8.3 g/dL   Albumin 4.0 3.5 - 5.2 g/dL   GFR 80.84 >60.00 mL/min   Calcium 9.4 8.4 - 10.5 mg/dL  Hemoglobin A1c  Result Value Ref Range   Hgb A1c MFr Bld 5.6 4.6 - 6.5 %  Lipid panel  Result Value Ref Range   Cholesterol 190 0 - 200 mg/dL   Triglycerides 99.0 0.0 -  149.0 mg/dL   HDL 53.50 >39.00 mg/dL   VLDL 19.8 0.0 - 40.0 mg/dL   LDL Cholesterol 116 (H) 0 - 99 mg/dL   Total CHOL/HDL Ratio 4    NonHDL 136.11   PSA  Result Value Ref Range   PSA 0.97 0.10 - 4.00 ng/mL  TSH  Result Value Ref Range   TSH 2.29 0.35 - 5.50 uIU/mL

## 2022-06-29 ENCOUNTER — Encounter: Payer: Self-pay | Admitting: Family Medicine

## 2022-06-30 ENCOUNTER — Encounter: Payer: Self-pay | Admitting: Family Medicine

## 2022-06-30 ENCOUNTER — Ambulatory Visit (INDEPENDENT_AMBULATORY_CARE_PROVIDER_SITE_OTHER): Payer: BC Managed Care – PPO | Admitting: Family Medicine

## 2022-06-30 VITALS — BP 160/90 | HR 86 | Temp 97.9°F | Resp 18 | Ht 72.0 in | Wt 251.8 lb

## 2022-06-30 DIAGNOSIS — I499 Cardiac arrhythmia, unspecified: Secondary | ICD-10-CM | POA: Diagnosis not present

## 2022-06-30 DIAGNOSIS — Z20820 Contact with and (suspected) exposure to varicella: Secondary | ICD-10-CM

## 2022-06-30 DIAGNOSIS — Z0000 Encounter for general adult medical examination without abnormal findings: Secondary | ICD-10-CM | POA: Diagnosis not present

## 2022-06-30 DIAGNOSIS — Z125 Encounter for screening for malignant neoplasm of prostate: Secondary | ICD-10-CM | POA: Diagnosis not present

## 2022-06-30 DIAGNOSIS — Z131 Encounter for screening for diabetes mellitus: Secondary | ICD-10-CM

## 2022-06-30 DIAGNOSIS — I10 Essential (primary) hypertension: Secondary | ICD-10-CM | POA: Diagnosis not present

## 2022-06-30 DIAGNOSIS — F418 Other specified anxiety disorders: Secondary | ICD-10-CM

## 2022-06-30 DIAGNOSIS — Z1211 Encounter for screening for malignant neoplasm of colon: Secondary | ICD-10-CM

## 2022-06-30 DIAGNOSIS — E785 Hyperlipidemia, unspecified: Secondary | ICD-10-CM

## 2022-06-30 LAB — COMPREHENSIVE METABOLIC PANEL
ALT: 17 U/L (ref 0–53)
AST: 16 U/L (ref 0–37)
Albumin: 4 g/dL (ref 3.5–5.2)
Alkaline Phosphatase: 84 U/L (ref 39–117)
BUN: 13 mg/dL (ref 6–23)
CO2: 28 mEq/L (ref 19–32)
Calcium: 9.4 mg/dL (ref 8.4–10.5)
Chloride: 102 mEq/L (ref 96–112)
Creatinine, Ser: 1.03 mg/dL (ref 0.40–1.50)
GFR: 80.84 mL/min (ref >60.00)
Glucose, Bld: 102 mg/dL — ABNORMAL HIGH (ref 70–99)
Potassium: 3.9 mEq/L (ref 3.5–5.1)
Sodium: 139 mEq/L (ref 135–145)
Total Bilirubin: 1.2 mg/dL (ref 0.2–1.2)
Total Protein: 6.9 g/dL (ref 6.0–8.3)

## 2022-06-30 LAB — CBC
HCT: 45.6 % (ref 39.0–52.0)
Hemoglobin: 15.9 g/dL (ref 13.0–17.0)
MCHC: 34.9 g/dL (ref 30.0–36.0)
MCV: 82.7 fl (ref 78.0–100.0)
Platelets: 231 10*3/uL (ref 150.0–400.0)
RBC: 5.52 Mil/uL (ref 4.22–5.81)
RDW: 13.9 % (ref 11.5–15.5)
WBC: 7.8 10*3/uL (ref 4.0–10.5)

## 2022-06-30 LAB — LIPID PANEL
Cholesterol: 190 mg/dL (ref 0–200)
HDL: 53.5 mg/dL (ref >39.00)
LDL Cholesterol: 116 mg/dL — ABNORMAL HIGH (ref 0–99)
NonHDL: 136.11
Total CHOL/HDL Ratio: 4
Triglycerides: 99 mg/dL (ref 0.0–149.0)
VLDL: 19.8 mg/dL (ref 0.0–40.0)

## 2022-06-30 LAB — TSH: TSH: 2.29 u[IU]/mL (ref 0.35–5.50)

## 2022-06-30 LAB — PSA: PSA: 0.97 ng/mL (ref 0.10–4.00)

## 2022-06-30 LAB — HEMOGLOBIN A1C: Hgb A1c MFr Bld: 5.6 % (ref 4.6–6.5)

## 2022-06-30 MED ORDER — AMLODIPINE BESYLATE 10 MG PO TABS: 10.0000 mg | ORAL_TABLET | Freq: Every day | ORAL | 3 refills | Status: DC

## 2022-06-30 MED ORDER — LISINOPRIL 40 MG PO TABS: 40.0000 mg | ORAL_TABLET | Freq: Every day | ORAL | 3 refills | Status: DC

## 2022-06-30 MED ORDER — ROSUVASTATIN CALCIUM 10 MG PO TABS: 10.0000 mg | ORAL_TABLET | Freq: Every day | ORAL | 3 refills | Status: DC

## 2022-06-30 MED ORDER — BUSPIRONE HCL 15 MG PO TABS: 15.0000 mg | ORAL_TABLET | Freq: Two times a day (BID) | ORAL | 3 refills | Status: AC

## 2022-06-30 MED ORDER — HYDROCHLOROTHIAZIDE 25 MG PO TABS: 25.0000 mg | ORAL_TABLET | Freq: Every day | ORAL | 3 refills | Status: DC

## 2022-07-01 ENCOUNTER — Encounter: Payer: Self-pay | Admitting: Family Medicine

## 2022-07-01 LAB — VARICELLA ZOSTER ANTIBODY, IGG: Varicella IgG: 803.5 index

## 2022-07-07 ENCOUNTER — Telehealth: Payer: Self-pay | Admitting: Cardiology

## 2022-07-07 NOTE — Telephone Encounter (Signed)
Left voice mail for patient to schedule appt with Mamie Levers, NP

## 2022-07-07 NOTE — Telephone Encounter (Signed)
-----   Message from Vickie Epley, MD sent at 07/03/2022  6:22 PM EDT ----- Sure.  Gabriel Cirri, can you get Mr Osoria a follow up with Suzann? Thanks! Lars Mage      ----- Message ----- From: Darreld Mclean, MD Sent: 06/30/2022   5:01 PM EDT To: Vickie Epley, MD  Hi Cameron-this patient is well overdue to see you for follow-up.  Would you be so kind is to ask your staff to give him a call? Thank so much  JC

## 2022-07-15 ENCOUNTER — Other Ambulatory Visit: Payer: Self-pay | Admitting: Family Medicine

## 2022-07-15 DIAGNOSIS — G4709 Other insomnia: Secondary | ICD-10-CM

## 2022-07-15 MED ORDER — ZOLPIDEM TARTRATE 10 MG PO TABS: 5.0000 mg | ORAL_TABLET | Freq: Every evening | ORAL | 1 refills | Status: DC | PRN

## 2022-08-18 ENCOUNTER — Telehealth: Payer: BC Managed Care – PPO | Admitting: Family Medicine

## 2022-08-19 NOTE — Progress Notes (Signed)
Appt canceled!

## 2022-08-26 NOTE — Telephone Encounter (Signed)
Left voicemail to schedule follow up with Sherie Don, NP

## 2022-10-22 ENCOUNTER — Telehealth: Payer: Self-pay | Admitting: Cardiology

## 2022-10-22 NOTE — Telephone Encounter (Signed)
-----   Message from Cameron T Lambert, MD sent at 07/03/2022  6:22 PM EDT ----- Sure.  Sabrina, can you get Mr Deltoro a follow up with Suzann? Thanks! Cameron Lambert      ----- Message ----- From: Copland, Jessica C, MD Sent: 06/30/2022   5:01 PM EDT To: Cameron T Lambert, MD  Hi Cameron-this patient is well overdue to see you for follow-up.  Would you be so kind is to ask your staff to give him a call? Thank so much  JC   

## 2022-10-22 NOTE — Telephone Encounter (Signed)
Left voicemail to schedule past due follow up

## 2022-11-21 ENCOUNTER — Telehealth: Payer: Self-pay | Admitting: Family Medicine

## 2022-11-21 ENCOUNTER — Encounter: Payer: Self-pay | Admitting: Family Medicine

## 2022-11-21 DIAGNOSIS — U071 COVID-19: Secondary | ICD-10-CM

## 2022-11-21 MED ORDER — NIRMATRELVIR/RITONAVIR (PAXLOVID) TABLET (RENAL DOSING)
2.0000 | ORAL_TABLET | Freq: Two times a day (BID) | ORAL | 0 refills | Status: AC
Start: 2022-11-21 — End: 2022-11-26

## 2022-11-21 NOTE — Telephone Encounter (Signed)
Wife called to advise that patient collapsed at work couple of days ago and was sent to [facility] (please see note from 11/19/22) . Wife said that they determined it being a result of his heart issues that have become more complex and that his is positive for covid. Pt and wife are calling to see if dr. Patsy Lager can call in Paxlovid due to the heart issues (she is also on it since he brought it home) he has. Pt has runny nose, aches, chills, coughing, phlegm, sneezing and he is winded/weak. Please send medication to [facility].    Hospital also sent him home with heart monitor and advised him to stop taking the lisinopril and hydrochlorothiazide and instead take Metoprolol succinate. Patient's wife does not want to give him the wrong thing or have him taking something he shouldn't be. Pt was not taking his bp meds like he should have been. Pt's potassium was low but they supplemented while he was in the hospital but didn't give any prescriptions so wife wants to know if she should continue with hydrochlorothiazide. Please review notes and advise pt what meds he should continue or discontinue, also okay to send mychart message. Please call wife or patient to discuss further or if there are any questions as it was a very detailed, complex call. Tried to capture everything she said.

## 2022-11-21 NOTE — Telephone Encounter (Signed)
Called wife. She will send a message.

## 2023-03-09 ENCOUNTER — Other Ambulatory Visit: Payer: Self-pay | Admitting: Family Medicine

## 2023-03-09 DIAGNOSIS — G4709 Other insomnia: Secondary | ICD-10-CM

## 2023-05-14 ENCOUNTER — Other Ambulatory Visit: Payer: Self-pay | Admitting: Family Medicine

## 2023-05-14 DIAGNOSIS — G4709 Other insomnia: Secondary | ICD-10-CM

## 2023-11-19 ENCOUNTER — Other Ambulatory Visit: Payer: Self-pay

## 2023-11-19 ENCOUNTER — Encounter: Payer: Self-pay | Admitting: Family Medicine

## 2023-11-19 DIAGNOSIS — Z1211 Encounter for screening for malignant neoplasm of colon: Secondary | ICD-10-CM

## 2024-01-11 ENCOUNTER — Encounter: Payer: Self-pay | Admitting: Family Medicine

## 2024-01-11 ENCOUNTER — Other Ambulatory Visit: Payer: Self-pay | Admitting: Family Medicine

## 2024-01-11 DIAGNOSIS — I10 Essential (primary) hypertension: Secondary | ICD-10-CM

## 2024-01-11 DIAGNOSIS — E785 Hyperlipidemia, unspecified: Secondary | ICD-10-CM

## 2024-01-11 DIAGNOSIS — G4709 Other insomnia: Secondary | ICD-10-CM

## 2024-01-12 MED ORDER — AMLODIPINE BESYLATE 10 MG PO TABS: 10.0000 mg | ORAL_TABLET | Freq: Every day | ORAL | 1 refills | Status: DC

## 2024-01-12 MED ORDER — ROSUVASTATIN CALCIUM 10 MG PO TABS: 10.0000 mg | ORAL_TABLET | Freq: Every day | ORAL | 1 refills | Status: DC

## 2024-01-12 NOTE — Addendum Note (Signed)
 Addended by: Eliora Nienhuis M on: 01/12/2024 03:52 PM   Modules accepted: Orders
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
## 2024-01-25 ENCOUNTER — Encounter: Payer: Self-pay | Admitting: Family Medicine

## 2024-01-25 ENCOUNTER — Ambulatory Visit (INDEPENDENT_AMBULATORY_CARE_PROVIDER_SITE_OTHER): Admitting: Family Medicine

## 2024-01-25 ENCOUNTER — Telehealth: Payer: Self-pay

## 2024-01-25 VITALS — BP 150/90 | HR 79 | Temp 99.0°F | Ht 72.0 in | Wt 270.4 lb

## 2024-01-25 DIAGNOSIS — I471 Supraventricular tachycardia, unspecified: Secondary | ICD-10-CM

## 2024-01-25 DIAGNOSIS — E785 Hyperlipidemia, unspecified: Secondary | ICD-10-CM

## 2024-01-25 DIAGNOSIS — I10 Essential (primary) hypertension: Secondary | ICD-10-CM | POA: Diagnosis not present

## 2024-01-25 DIAGNOSIS — R350 Frequency of micturition: Secondary | ICD-10-CM | POA: Diagnosis not present

## 2024-01-25 DIAGNOSIS — Z0000 Encounter for general adult medical examination without abnormal findings: Secondary | ICD-10-CM | POA: Diagnosis not present

## 2024-01-25 DIAGNOSIS — G4709 Other insomnia: Secondary | ICD-10-CM | POA: Diagnosis not present

## 2024-01-25 DIAGNOSIS — Z131 Encounter for screening for diabetes mellitus: Secondary | ICD-10-CM | POA: Diagnosis not present

## 2024-01-25 DIAGNOSIS — Z23 Encounter for immunization: Secondary | ICD-10-CM | POA: Diagnosis not present

## 2024-01-25 DIAGNOSIS — Z125 Encounter for screening for malignant neoplasm of prostate: Secondary | ICD-10-CM | POA: Diagnosis not present

## 2024-01-25 DIAGNOSIS — Z1211 Encounter for screening for malignant neoplasm of colon: Secondary | ICD-10-CM

## 2024-01-25 LAB — POCT URINALYSIS DIP (MANUAL ENTRY)
Bilirubin, UA: NEGATIVE
Blood, UA: NEGATIVE
Glucose, UA: NEGATIVE mg/dL
Ketones, POC UA: NEGATIVE mg/dL
Leukocytes, UA: NEGATIVE
Nitrite, UA: NEGATIVE
Protein Ur, POC: NEGATIVE mg/dL
Spec Grav, UA: 1.01 (ref 1.010–1.025)
Urobilinogen, UA: 0.2 U/dL
pH, UA: 6.5 (ref 5.0–8.0)

## 2024-01-25 MED ORDER — AMLODIPINE BESYLATE 10 MG PO TABS: 10.0000 mg | ORAL_TABLET | Freq: Every day | ORAL | 3 refills | Status: AC

## 2024-01-25 MED ORDER — TAMSULOSIN HCL 0.4 MG PO CAPS: 0.4000 mg | ORAL_CAPSULE | Freq: Every day | ORAL | 3 refills | Status: AC

## 2024-01-25 MED ORDER — ZOLPIDEM TARTRATE 10 MG PO TABS: 5.0000 mg | ORAL_TABLET | Freq: Every evening | ORAL | 1 refills | Status: DC | PRN

## 2024-01-25 MED ORDER — LISINOPRIL 20 MG PO TABS: 20.0000 mg | ORAL_TABLET | Freq: Every day | ORAL | 3 refills | Status: AC

## 2024-01-25 MED ORDER — ROSUVASTATIN CALCIUM 10 MG PO TABS: 10.0000 mg | ORAL_TABLET | Freq: Every day | ORAL | 3 refills | Status: AC

## 2024-01-25 MED ORDER — METOPROLOL SUCCINATE ER 25 MG PO TB24: 37.5000 mg | ORAL_TABLET | Freq: Two times a day (BID) | ORAL | 3 refills | Status: AC

## 2024-01-25 NOTE — Patient Instructions (Addendum)
 Good to see you again today Flu, pneumonia and tetanus shots today Recommend the shingles series at your pharmacy and a covid booster this fall at your convenience  I will be in touch with your labs asap   Add back lisinopril  20 mg for your BP control and flomax to help with urination   Take care of that cologuard kit!

## 2024-01-25 NOTE — Telephone Encounter (Signed)
 Order for urine culture was acknowledged after urine was dumped ,   Ucx placed in future , pt called and lvm to return call to schedule a lab appointment

## 2024-01-26 ENCOUNTER — Encounter: Payer: Self-pay | Admitting: Family Medicine

## 2024-01-26 LAB — LIPID PANEL
Cholesterol: 158 mg/dL (ref 0–200)
HDL: 50 mg/dL (ref >39.00)
LDL Cholesterol: 83 mg/dL (ref 0–99)
NonHDL: 108.47
Total CHOL/HDL Ratio: 3
Triglycerides: 128 mg/dL (ref 0.0–149.0)
VLDL: 25.6 mg/dL (ref 0.0–40.0)

## 2024-01-26 LAB — CBC
HCT: 45.7 % (ref 39.0–52.0)
Hemoglobin: 15.3 g/dL (ref 13.0–17.0)
MCHC: 33.4 g/dL (ref 30.0–36.0)
MCV: 83.9 fl (ref 78.0–100.0)
Platelets: 264 K/uL (ref 150.0–400.0)
RBC: 5.44 Mil/uL (ref 4.22–5.81)
RDW: 13.9 % (ref 11.5–15.5)
WBC: 9.2 K/uL (ref 4.0–10.5)

## 2024-01-26 LAB — COMPREHENSIVE METABOLIC PANEL WITH GFR
ALT: 26 U/L (ref 0–53)
AST: 19 U/L (ref 0–37)
Albumin: 4.1 g/dL (ref 3.5–5.2)
Alkaline Phosphatase: 94 U/L (ref 39–117)
BUN: 11 mg/dL (ref 6–23)
CO2: 29 meq/L (ref 19–32)
Calcium: 9.1 mg/dL (ref 8.4–10.5)
Chloride: 103 meq/L (ref 96–112)
Creatinine, Ser: 0.97 mg/dL (ref 0.40–1.50)
GFR: 85.92 mL/min (ref >60.00)
Glucose, Bld: 97 mg/dL (ref 70–99)
Potassium: 4.1 meq/L (ref 3.5–5.1)
Sodium: 140 meq/L (ref 135–145)
Total Bilirubin: 0.8 mg/dL (ref 0.2–1.2)
Total Protein: 7.1 g/dL (ref 6.0–8.3)

## 2024-01-26 LAB — HEMOGLOBIN A1C: Hgb A1c MFr Bld: 5.8 % (ref 4.6–6.5)

## 2024-01-26 LAB — PSA: PSA: 1.36 ng/mL (ref 0.10–4.00)

## 2024-02-11 ENCOUNTER — Encounter: Payer: Self-pay | Admitting: Family Medicine

## 2024-02-14 NOTE — Progress Notes (Unsigned)
 [facility] at [facility] [address], [address] [place], [place] [address] [phone]  Date:  02/15/2024   Name:  Brian Perkins   DOB:  November 09, 1965   MRN:  [id number]  PCP:  Watt Harlene BROCKS, MD    Chief Complaint: No chief complaint on file.   History of Present Illness:  Brian Perkins is a 58 y.o. very pleasant male patient who presents with the following:  Patient seen today with concern of a productive cough.  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  History of Present Illness Brian Perkins is a 58 year old male who presents with a persistent cough and symptoms suggestive of a sinus infection. He is accompanied by his wife.  He has been experiencing symptoms consistent with a sinus infection for the past two weeks. Initially, he managed his symptoms with Mucinex and over-the-counter cough syrup, which provided some relief but did not completely resolve the issue.  He describes a persistent dry cough, occasionally producing yellow mucus, which has decreased in quantity since the onset. The cough is sometimes rapid, occurring in 'cough attacks' of 15 to 20 consecutive coughs. He notes that the cough is primarily located in his chest, with sensations felt upon inhalation but not exhalation.  No fever, chills, or vomiting. He continues to experience sinus drainage, requiring him to blow his nose  frequently, though the discharge is more runny than thick.  He is currently taking Ambien  for sleep.    Patient Active Problem List   Diagnosis Date Noted   Syncope and collapse 05/22/2020   Tachycardia 05/22/2020   Sepsis (HCC) 04/27/2016   Obesity 08/28/2014   Hypogonadism male 07/21/2013   Essential hypertension, benign 07/11/2011    Past Medical History:  Diagnosis Date   Allergy    Hypertension    Hypogonadism male     Past Surgical History:  Procedure Laterality Date   CHOLECYSTECTOMY      Social History   Tobacco Use   Smoking status: Former    Current packs/day: 0.00    Types: Cigarettes, Cigars    Quit date: 04/14/1989    Years since quitting: 34.8   Smokeless tobacco: Former   Tobacco comments:    not much of smoker  Substance Use Topics   Alcohol use: Yes    Comment: rarely 1 or 2 beers   Drug use: No    Family History  Problem Relation Age of Onset   Diabetes Mother    Hyperlipidemia Father    Hypertension Father     No Known Allergies  Medication list has been reviewed and updated.  Current Outpatient Medications on File Prior to Visit  Medication Sig Dispense Refill   acetaminophen  (TYLENOL ) 325 MG tablet Take 650 mg by mouth every 6 (six) hours as needed.     amLODipine  (NORVASC ) 10 MG tablet Take 1 tablet (10 mg  total) by mouth daily. 90 tablet 3   aspirin  81 MG tablet Take 81 mg by mouth daily.     benzonatate  (TESSALON ) 100 MG capsule Take 1 capsule (100 mg total) by mouth 3 (three) times daily as needed for cough. 30 capsule 0   busPIRone  (BUSPAR ) 15 MG tablet Take 1 tablet (15 mg total) by mouth 2 (two) times daily. 180 tablet 3   fluticasone  (FLONASE ) 50 MCG/ACT nasal spray Place 2 sprays into the nose daily. 16 g 6   lisinopril  (ZESTRIL ) 20 MG tablet Take 1 tablet (20 mg total) by mouth daily. 90 tablet 3   metoprolol succinate (TOPROL-XL) 25 MG 24 hr tablet Take 1.5 tablets (37.5 mg total) by mouth in the morning and at bedtime.  270 tablet 3   propranolol  (INDERAL ) 20 MG tablet TAKE 1 TABLET (20 MG TOTAL) BY MOUTH 2 (TWO) TIMES DAILY. USE AS NEEDED FOR PERFORMANCE ANXIETY 180 tablet 0   rosuvastatin  (CRESTOR ) 10 MG tablet Take 1 tablet (10 mg total) by mouth daily. 90 tablet 3   tamsulosin (FLOMAX) 0.4 MG CAPS capsule Take 1 capsule (0.4 mg total) by mouth daily. 90 capsule 3   Triamcinolone Acetonide (NASACORT AQ NA) Place into the nose.     zolpidem  (AMBIEN ) 10 MG tablet Take 0.5-1 tablets (5-10 mg total) by mouth at bedtime as needed for sleep. 30 tablet 1   No current facility-administered medications on file prior to visit.    Review of Systems:  As per HPI- otherwise negative.   Physical Examination: There were no vitals filed for this visit. There were no vitals filed for this visit. There is no height or weight on file to calculate BMI. Ideal Body Weight:   Pt observed via mychart video- he looks well    Assessment and Plan: Acute bronchitis, unspecified organism - Plan: azithromycin  (ZITHROMAX ) 250 MG tablet  Acute cough - Plan: benzonatate  (TESSALON ) 100 MG capsule  Assessment & Plan Acute bronchitis with acute cough Persistent cough for two weeks with dry cough, occasional yellow sputum, and sinus drainage. Symptoms suggest bronchitis over sinus infection. - Initiated azithromycin : two pills on day one, then one pill daily for four days. - Prescribed Tessalon  Perles for cough. - Discussed safe use of Tessalon  Perles with Ambien . - Advised against narcotic cough syrups due to interactions with Ambien . - Informed that coughs can persist for weeks, but improvement expected soon. - Advised to report if no improvement in a few days.  Signed Harlene Schroeder, MD

## 2024-02-15 ENCOUNTER — Telehealth (INDEPENDENT_AMBULATORY_CARE_PROVIDER_SITE_OTHER): Admitting: Family Medicine

## 2024-02-15 ENCOUNTER — Other Ambulatory Visit: Payer: Self-pay | Admitting: Family Medicine

## 2024-02-15 DIAGNOSIS — E785 Hyperlipidemia, unspecified: Secondary | ICD-10-CM

## 2024-02-15 DIAGNOSIS — R051 Acute cough: Secondary | ICD-10-CM | POA: Diagnosis not present

## 2024-02-15 DIAGNOSIS — J209 Acute bronchitis, unspecified: Secondary | ICD-10-CM

## 2024-02-15 DIAGNOSIS — I10 Essential (primary) hypertension: Secondary | ICD-10-CM

## 2024-02-15 MED ORDER — BENZONATATE 100 MG PO CAPS: 100.0000 mg | ORAL_CAPSULE | Freq: Three times a day (TID) | ORAL | 0 refills | Status: AC

## 2024-02-15 MED ORDER — AZITHROMYCIN 250 MG PO TABS: ORAL_TABLET | ORAL | 0 refills | Status: AC

## 2024-05-11 ENCOUNTER — Other Ambulatory Visit: Payer: Self-pay | Admitting: Family Medicine

## 2024-05-11 DIAGNOSIS — G4709 Other insomnia: Secondary | ICD-10-CM
